# Patient Record
Sex: Female | Born: 1970 | Race: Black or African American | Hispanic: No | Marital: Single | Smoking: Never smoker
Health system: Southern US, Community
[De-identification: ages and names within clinical notes are randomized; demographics above are authoritative.]

## PROBLEM LIST (undated history)

## (undated) DIAGNOSIS — I351 Nonrheumatic aortic (valve) insufficiency: Secondary | ICD-10-CM

## (undated) DIAGNOSIS — I890 Lymphedema, not elsewhere classified: Secondary | ICD-10-CM

## (undated) DIAGNOSIS — F32A Depression, unspecified: Secondary | ICD-10-CM

## (undated) DIAGNOSIS — E785 Hyperlipidemia, unspecified: Secondary | ICD-10-CM

## (undated) DIAGNOSIS — F419 Anxiety disorder, unspecified: Secondary | ICD-10-CM

## (undated) DIAGNOSIS — F329 Major depressive disorder, single episode, unspecified: Secondary | ICD-10-CM

## (undated) DIAGNOSIS — M1990 Unspecified osteoarthritis, unspecified site: Secondary | ICD-10-CM

## (undated) DIAGNOSIS — E119 Type 2 diabetes mellitus without complications: Secondary | ICD-10-CM

## (undated) DIAGNOSIS — D689 Coagulation defect, unspecified: Secondary | ICD-10-CM

## (undated) DIAGNOSIS — G4700 Insomnia, unspecified: Secondary | ICD-10-CM

## (undated) DIAGNOSIS — J309 Allergic rhinitis, unspecified: Secondary | ICD-10-CM

## (undated) DIAGNOSIS — E559 Vitamin D deficiency, unspecified: Secondary | ICD-10-CM

## (undated) DIAGNOSIS — R011 Cardiac murmur, unspecified: Secondary | ICD-10-CM

## (undated) DIAGNOSIS — Z30432 Encounter for removal of intrauterine contraceptive device: Secondary | ICD-10-CM

## (undated) DIAGNOSIS — Z8672 Personal history of thrombophlebitis: Secondary | ICD-10-CM

## (undated) DIAGNOSIS — M62838 Other muscle spasm: Secondary | ICD-10-CM

## (undated) DIAGNOSIS — N943 Premenstrual tension syndrome: Secondary | ICD-10-CM

## (undated) DIAGNOSIS — I10 Essential (primary) hypertension: Secondary | ICD-10-CM

## (undated) HISTORY — DX: Other muscle spasm: M62.838

## (undated) HISTORY — DX: Major depressive disorder, single episode, unspecified: F32.9

## (undated) HISTORY — DX: Hyperlipidemia, unspecified: E78.5

## (undated) HISTORY — DX: Insomnia, unspecified: G47.00

## (undated) HISTORY — DX: Encounter for removal of intrauterine contraceptive device: Z30.432

## (undated) HISTORY — DX: Cardiac murmur, unspecified: R01.1

## (undated) HISTORY — DX: Essential (primary) hypertension: I10

## (undated) HISTORY — DX: Anxiety disorder, unspecified: F41.9

## (undated) HISTORY — DX: Type 2 diabetes mellitus without complications: E11.9

## (undated) HISTORY — DX: Coagulation defect, unspecified: D68.9

## (undated) HISTORY — DX: Personal history of thrombophlebitis: Z86.72

## (undated) HISTORY — DX: Vitamin D deficiency, unspecified: E55.9

## (undated) HISTORY — DX: Depression, unspecified: F32.A

## (undated) HISTORY — DX: Morbid (severe) obesity due to excess calories: E66.01

## (undated) HISTORY — PX: CHOLECYSTECTOMY: SHX55

## (undated) HISTORY — DX: Allergic rhinitis, unspecified: J30.9

## (undated) HISTORY — DX: Premenstrual tension syndrome: N94.3

## (undated) HISTORY — DX: Lymphedema, not elsewhere classified: I89.0

## (undated) HISTORY — DX: Nonrheumatic aortic (valve) insufficiency: I35.1

## (undated) HISTORY — PX: GASTRIC BYPASS: SHX52

## (undated) HISTORY — DX: Unspecified osteoarthritis, unspecified site: M19.90

---

## 2004-01-20 DIAGNOSIS — Z86718 Personal history of other venous thrombosis and embolism: Secondary | ICD-10-CM | POA: Insufficient documentation

## 2004-02-20 ENCOUNTER — Other Ambulatory Visit: Payer: Self-pay

## 2004-07-29 ENCOUNTER — Other Ambulatory Visit: Payer: Self-pay

## 2005-01-26 ENCOUNTER — Ambulatory Visit: Payer: Self-pay

## 2005-09-17 DIAGNOSIS — Z9884 Bariatric surgery status: Secondary | ICD-10-CM | POA: Insufficient documentation

## 2008-02-12 DIAGNOSIS — E668 Other obesity: Secondary | ICD-10-CM | POA: Insufficient documentation

## 2009-05-14 ENCOUNTER — Emergency Department: Payer: Self-pay | Admitting: Emergency Medicine

## 2009-06-02 ENCOUNTER — Ambulatory Visit: Payer: Self-pay | Admitting: Family Medicine

## 2009-12-29 DIAGNOSIS — M722 Plantar fascial fibromatosis: Secondary | ICD-10-CM | POA: Insufficient documentation

## 2010-01-05 ENCOUNTER — Ambulatory Visit: Payer: Self-pay | Admitting: Family Medicine

## 2010-01-05 DIAGNOSIS — I359 Nonrheumatic aortic valve disorder, unspecified: Secondary | ICD-10-CM | POA: Insufficient documentation

## 2011-01-12 ENCOUNTER — Ambulatory Visit: Payer: Self-pay | Admitting: Family Medicine

## 2012-12-11 ENCOUNTER — Ambulatory Visit: Payer: Self-pay | Admitting: Family Medicine

## 2013-02-21 ENCOUNTER — Emergency Department: Payer: Self-pay | Admitting: Emergency Medicine

## 2013-09-21 LAB — HM PAP SMEAR: HM Pap smear: NORMAL

## 2013-11-21 ENCOUNTER — Emergency Department: Payer: Self-pay | Admitting: Emergency Medicine

## 2013-11-21 LAB — RAPID INFLUENZA A&B ANTIGENS

## 2014-08-13 ENCOUNTER — Ambulatory Visit: Payer: Self-pay | Admitting: Family Medicine

## 2014-08-13 LAB — HM MAMMOGRAPHY: HM Mammogram: NORMAL

## 2014-09-18 ENCOUNTER — Encounter: Payer: Self-pay | Admitting: *Deleted

## 2014-09-19 ENCOUNTER — Encounter (INDEPENDENT_AMBULATORY_CARE_PROVIDER_SITE_OTHER): Payer: Self-pay

## 2014-09-19 ENCOUNTER — Ambulatory Visit (INDEPENDENT_AMBULATORY_CARE_PROVIDER_SITE_OTHER): Payer: 59 | Admitting: Cardiovascular Disease

## 2014-09-19 ENCOUNTER — Encounter: Payer: Self-pay | Admitting: Cardiovascular Disease

## 2014-09-19 VITALS — BP 115/83 | HR 66 | Ht 64.0 in | Wt 341.5 lb

## 2014-09-19 DIAGNOSIS — H9191 Unspecified hearing loss, right ear: Secondary | ICD-10-CM

## 2014-09-19 DIAGNOSIS — R7303 Prediabetes: Secondary | ICD-10-CM

## 2014-09-19 DIAGNOSIS — E785 Hyperlipidemia, unspecified: Secondary | ICD-10-CM

## 2014-09-19 DIAGNOSIS — R7309 Other abnormal glucose: Secondary | ICD-10-CM

## 2014-09-19 DIAGNOSIS — R002 Palpitations: Secondary | ICD-10-CM | POA: Insufficient documentation

## 2014-09-19 DIAGNOSIS — I10 Essential (primary) hypertension: Secondary | ICD-10-CM

## 2014-09-19 DIAGNOSIS — Z9884 Bariatric surgery status: Secondary | ICD-10-CM

## 2014-09-19 DIAGNOSIS — H9190 Unspecified hearing loss, unspecified ear: Secondary | ICD-10-CM | POA: Insufficient documentation

## 2014-09-19 NOTE — Assessment & Plan Note (Signed)
Blood pressure well controlled on today's visit.

## 2014-09-19 NOTE — Assessment & Plan Note (Signed)
Tolerating her Lipitor 40 mg daily

## 2014-09-19 NOTE — Assessment & Plan Note (Signed)
She denies having any heart palpitations. She actually has ulceration in the right side of her head. No symptoms for the past week. Etiology unclear. She reports that her blood pressure has been well controlled, she denies any cardiac palpitations or fluttering. No recent upper respiratory infections. Symptoms did seem to resolve with prednisone over the past several days which was prescribed for laryngitis. We have recommended that if she has any cardiac palpitations or pulsations in her head concerning for irregular heart  Rhythm, that she call our office for a Holter monitor. She works for [employer] and the Holter would be done through her work

## 2014-09-19 NOTE — Patient Instructions (Signed)
Your next appointment will be scheduled in our new office located at :  [facility]  [address], [place], [place] [address]  You are doing well. No medication changes were made.  Go to the apps store Search for "pulse meter" Download instant heart rate, cardiograph  Call for worsening head pulsations or heart fluttering, We could order a holter monitor  Please call us if you have new issues that need to be addressed before your next appt.

## 2014-09-19 NOTE — Progress Notes (Signed)
Patient ID: Kara Richmond, female    DOB: Mar 06, 1971, 43 y.o.   MRN: [id number]  HPI Comments: Kara Richmond is a pleasant 42 year old woman with morbid obesity, history of gastric bypass, hypertension, diet-controlled diabetes type 2, history of DVT, mild aortic valve regurgitation by echocardiogram, who presents for new patient evaluation for pulsation in the right side of her head. She is a patient of Dr. Ancil Boozer.  She reports that for 2 weeks she had a notable pulsation in the right side of her head in her ear. Typically this would occur during the daytime, when she was sitting, denying having any symptoms at nighttime or when she is sleeping. Symptoms would only occur in her right ear. In her left ear, she typically had an ear piece as she was listening to books on tape. Symptoms were significant for 2 weeks that have been better in the past week. She does report that she has been on prednisone for several days this week. She denies any upper respiratory infection, no sinus congestion, no nasal discharge, no cough. She does have laryngitis for which she was taking the prednisone. She she developed this after the flu shot.  She reports having chronic sweating, chronic balance issues She is concerned as she is having some hearing loss in the right ear as well. She states that there is a family history of hearing loss He denies any tachycardia or palpitations  Recent lab work shows hemoglobin A1c 5.9, total cholesterol 209, LDL 136, vitamin D 32  EKG today shows normal sinus rhythm with no significant ST or T-wave changes EKG from primary care is essentially normal  Social History   Marital Status: Single                            Occupational History Works at [employer] Top[employer]   Smoking Status: Never Smoker                             Alcohol Use: No             Drug Use: No            Family history; indicated that her mother is alive. She indicated that her father  is alive. She indicated that her sister is alive.  History of hearing loss, no significant history of coronary artery disease       Outpatient Encounter Prescriptions as of 09/19/2014  Medication Sig  . ALPRAZolam (XANAX) 0.5 MG tablet Take 0.5 mg by mouth at bedtime as needed for anxiety.  Marland Kitchen aspirin 81 MG tablet Take 81 mg by mouth daily.  Marland Kitchen atorvastatin (LIPITOR) 40 MG tablet Take 40 mg by mouth daily.  . Bismuth Subsalicylate (PEPTO-BISMOL PO) Take by mouth as needed.  . Calcium-Vitamin D-Vitamin K ([employer] CALCIUM SOFT CHEWS) [phone] MG-UNT-MCG CHEW Chew 2 tablets by mouth 2 (two) times daily.  . Cholecalciferol (VITAMIN D) 2000 UNITS tablet Take 2,000 Units by mouth daily.  . citalopram (CELEXA) 40 MG tablet Take 40 mg by mouth daily.  Marland Kitchen EPINEPHrine (EPIPEN 2-PAK) 0.3 mg/0.3 mL IJ SOAJ injection Inject 0.3 mg into the muscle once.  Marland Kitchen levonorgestrel (MIRENA) 20 MCG/24HR IUD 1 each by Intrauterine route once.  Marland Kitchen lisinopril-hydrochlorothiazide (PRINZIDE,ZESTORETIC) 10-12.5 MG per tablet Take 1 tablet by mouth daily.   Marland Kitchen MAGNESIUM GLUCONATE PO Take 500 mg by mouth 2 (two) times  daily.  . Multiple Vitamin (MULTIVITAMIN) tablet Take 1 tablet by mouth daily.  . predniSONE (DELTASONE) 20 MG tablet Take 20 mg by mouth daily with breakfast.   . temazepam (RESTORIL) 22.5 MG capsule Take 22.5 mg by mouth at bedtime as needed.   . traMADol (ULTRAM) 50 MG tablet Take 50 mg by mouth 2 (two) times daily.   . Zinc Gluconate 100 MG TABS Take by mouth daily.     Review of Systems  Constitutional: Negative.   HENT: Negative.        Pulsation in the right side of her head Hearing loss on the right  Eyes: Negative.   Respiratory: Negative.   Cardiovascular: Negative.   Gastrointestinal: Negative.   Endocrine: Negative.   Musculoskeletal: Negative.   Skin: Negative.   Allergic/Immunologic: Negative.   Neurological: Negative.   Hematological: Negative.   Psychiatric/Behavioral: Negative.    All other systems reviewed and are negative.   BP 115/83  Pulse 66  Ht 5' 4"  (1.626 m)  Wt 341 lb 8 oz (154.903 kg)  BMI 58.59 kg/m2  Physical Exam  Nursing note and vitals reviewed. Constitutional: She is oriented to person, place, and time. She appears well-developed and well-nourished.  Morbidly obese  HENT:  Head: Normocephalic.  Nose: Nose normal.  Mouth/Throat: Oropharynx is clear and moist.  Eyes: Conjunctivae are normal. Pupils are equal, round, and reactive to light.  Neck: Normal range of motion. Neck supple. No JVD present.  Cardiovascular: Normal rate, regular rhythm, S1 normal, S2 normal, normal heart sounds and intact distal pulses.  Exam reveals no gallop and no friction rub.   No murmur heard. Pulmonary/Chest: Effort normal and breath sounds normal. No respiratory distress. She has no wheezes. She has no rales. She exhibits no tenderness.  Abdominal: Soft. Bowel sounds are normal. She exhibits no distension. There is no tenderness.  Musculoskeletal: Normal range of motion. She exhibits no edema and no tenderness.  Lymphadenopathy:    She has no cervical adenopathy.  Neurological: She is alert and oriented to person, place, and time. Coordination normal.  Skin: Skin is warm and dry. No rash noted. No erythema.  Psychiatric: She has a normal mood and affect. Her behavior is normal. Judgment and thought content normal.    Assessment and Plan

## 2014-09-19 NOTE — Assessment & Plan Note (Signed)
We have encouraged continued exercise, careful diet management in an effort to lose weight. 

## 2014-09-19 NOTE — Assessment & Plan Note (Signed)
She is concerned about waxing waning hearing loss on the right. She does report a family history. Suggested she monitor her symptoms and discussed this with Dr. Ancil Boozer. If symptoms get worse, might need a hearing study or referral to ENT

## 2014-12-10 ENCOUNTER — Ambulatory Visit: Payer: Self-pay | Admitting: Family Medicine

## 2015-06-08 ENCOUNTER — Telehealth: Payer: Self-pay | Admitting: Family Medicine

## 2015-06-08 ENCOUNTER — Encounter: Payer: Self-pay | Admitting: Family Medicine

## 2015-06-08 DIAGNOSIS — G4700 Insomnia, unspecified: Secondary | ICD-10-CM | POA: Insufficient documentation

## 2015-06-08 DIAGNOSIS — Z8619 Personal history of other infectious and parasitic diseases: Secondary | ICD-10-CM | POA: Insufficient documentation

## 2015-06-08 DIAGNOSIS — F32A Depression, unspecified: Secondary | ICD-10-CM | POA: Insufficient documentation

## 2015-06-08 DIAGNOSIS — J309 Allergic rhinitis, unspecified: Secondary | ICD-10-CM | POA: Insufficient documentation

## 2015-06-08 DIAGNOSIS — E785 Hyperlipidemia, unspecified: Secondary | ICD-10-CM

## 2015-06-08 DIAGNOSIS — F419 Anxiety disorder, unspecified: Secondary | ICD-10-CM

## 2015-06-08 DIAGNOSIS — N943 Premenstrual tension syndrome: Secondary | ICD-10-CM | POA: Insufficient documentation

## 2015-06-08 DIAGNOSIS — M1990 Unspecified osteoarthritis, unspecified site: Secondary | ICD-10-CM | POA: Insufficient documentation

## 2015-06-08 DIAGNOSIS — E559 Vitamin D deficiency, unspecified: Secondary | ICD-10-CM | POA: Insufficient documentation

## 2015-06-08 DIAGNOSIS — H6990 Unspecified Eustachian tube disorder, unspecified ear: Secondary | ICD-10-CM | POA: Insufficient documentation

## 2015-06-08 DIAGNOSIS — F329 Major depressive disorder, single episode, unspecified: Secondary | ICD-10-CM | POA: Insufficient documentation

## 2015-06-08 DIAGNOSIS — I890 Lymphedema, not elsewhere classified: Secondary | ICD-10-CM | POA: Insufficient documentation

## 2015-06-08 DIAGNOSIS — Z79899 Other long term (current) drug therapy: Secondary | ICD-10-CM

## 2015-06-08 NOTE — Telephone Encounter (Signed)
PT REQUESTING LAB SLIP FOR LABS BEFORE CPE

## 2015-06-08 NOTE — Telephone Encounter (Signed)
Done. Ready for pick up I ordered CBC, comp panel and lipid panel . Ask patient if she would like to be checked for anything else

## 2015-06-09 NOTE — Telephone Encounter (Signed)
ERRENOUS

## 2015-06-10 ENCOUNTER — Ambulatory Visit: Payer: Self-pay | Admitting: Family Medicine

## 2015-06-20 LAB — CBC WITH DIFFERENTIAL/PLATELET
Basophils Absolute: 0 10*3/uL (ref 0.0–0.2)
Basos: 0 %
EOS (ABSOLUTE): 0 10*3/uL (ref 0.0–0.4)
Eos: 1 %
Hematocrit: 36.9 % (ref 34.0–46.6)
Hemoglobin: 11.7 g/dL (ref 11.1–15.9)
Immature Grans (Abs): 0 10*3/uL (ref 0.0–0.1)
Immature Granulocytes: 0 %
Lymphocytes Absolute: 2.1 10*3/uL (ref 0.7–3.1)
Lymphs: 37 %
MCH: 25 pg — ABNORMAL LOW (ref 26.6–33.0)
MCHC: 31.7 g/dL (ref 31.5–35.7)
MCV: 79 fL (ref 79–97)
Monocytes Absolute: 0.5 10*3/uL (ref 0.1–0.9)
Monocytes: 9 %
Neutrophils Absolute: 3.1 10*3/uL (ref 1.4–7.0)
Neutrophils: 53 %
Platelets: 362 10*3/uL (ref 150–379)
RBC: 4.68 x10E6/uL (ref 3.77–5.28)
RDW: 15.1 % (ref 12.3–15.4)
WBC: 5.8 10*3/uL (ref 3.4–10.8)

## 2015-06-20 LAB — LIPID PANEL
Chol/HDL Ratio: 3.6 ratio (ref 0.0–4.4)
Cholesterol, Total: 179 mg/dL (ref 100–199)
HDL: 50 mg/dL
LDL Calculated: 108 mg/dL — ABNORMAL HIGH (ref 0–99)
Triglycerides: 107 mg/dL (ref 0–149)
VLDL Cholesterol Cal: 21 mg/dL (ref 5–40)

## 2015-06-20 LAB — COMPREHENSIVE METABOLIC PANEL
ALT: 9 IU/L (ref 0–32)
AST: 11 IU/L (ref 0–40)
Albumin/Globulin Ratio: 1.3 (ref 1.1–2.5)
Albumin: 3.5 g/dL (ref 3.5–5.5)
Alkaline Phosphatase: 52 IU/L (ref 39–117)
BUN/Creatinine Ratio: 17 (ref 9–23)
BUN: 14 mg/dL (ref 6–24)
Bilirubin Total: <0.2 mg/dL (ref 0.0–1.2)
CO2: 21 mmol/L (ref 18–29)
Calcium: 8.8 mg/dL (ref 8.7–10.2)
Chloride: 103 mmol/L (ref 97–108)
Creatinine, Ser: 0.84 mg/dL (ref 0.57–1.00)
GFR calc Af Amer: 99 mL/min/{1.73_m2} (ref >59)
GFR calc non Af Amer: 86 mL/min/{1.73_m2} (ref >59)
Globulin, Total: 2.7 g/dL (ref 1.5–4.5)
Glucose: 97 mg/dL (ref 65–99)
Potassium: 4.6 mmol/L (ref 3.5–5.2)
Sodium: 142 mmol/L (ref 134–144)
Total Protein: 6.2 g/dL (ref 6.0–8.5)

## 2015-06-24 NOTE — Telephone Encounter (Signed)
Patient notified

## 2015-06-25 ENCOUNTER — Ambulatory Visit: Payer: Self-pay | Admitting: Family Medicine

## 2015-06-30 ENCOUNTER — Ambulatory Visit (INDEPENDENT_AMBULATORY_CARE_PROVIDER_SITE_OTHER): Payer: 59 | Admitting: Family Medicine

## 2015-06-30 ENCOUNTER — Encounter: Payer: Self-pay | Admitting: Family Medicine

## 2015-06-30 VITALS — BP 132/84 | HR 100 | Temp 97.7°F | Resp 16 | Ht 66.0 in | Wt 342.3 lb

## 2015-06-30 DIAGNOSIS — F329 Major depressive disorder, single episode, unspecified: Secondary | ICD-10-CM

## 2015-06-30 DIAGNOSIS — Z9884 Bariatric surgery status: Secondary | ICD-10-CM | POA: Diagnosis not present

## 2015-06-30 DIAGNOSIS — E785 Hyperlipidemia, unspecified: Secondary | ICD-10-CM

## 2015-06-30 DIAGNOSIS — J069 Acute upper respiratory infection, unspecified: Secondary | ICD-10-CM

## 2015-06-30 DIAGNOSIS — I890 Lymphedema, not elsewhere classified: Secondary | ICD-10-CM

## 2015-06-30 DIAGNOSIS — I10 Essential (primary) hypertension: Secondary | ICD-10-CM | POA: Diagnosis not present

## 2015-06-30 DIAGNOSIS — F419 Anxiety disorder, unspecified: Secondary | ICD-10-CM

## 2015-06-30 DIAGNOSIS — F32A Depression, unspecified: Secondary | ICD-10-CM

## 2015-06-30 DIAGNOSIS — F418 Other specified anxiety disorders: Secondary | ICD-10-CM

## 2015-06-30 MED ORDER — CITALOPRAM HYDROBROMIDE 40 MG PO TABS: 40.0000 mg | ORAL_TABLET | Freq: Every day | ORAL | Status: DC

## 2015-06-30 MED ORDER — BENZONATATE 200 MG PO CAPS: 200.0000 mg | ORAL_CAPSULE | Freq: Three times a day (TID) | ORAL | Status: DC | PRN

## 2015-06-30 MED ORDER — ALPRAZOLAM 0.5 MG PO TABS: 0.5000 mg | ORAL_TABLET | Freq: Every evening | ORAL | Status: DC | PRN

## 2015-06-30 MED ORDER — LISINOPRIL-HYDROCHLOROTHIAZIDE 10-12.5 MG PO TABS: 1.0000 | ORAL_TABLET | Freq: Every day | ORAL | Status: DC

## 2015-06-30 NOTE — Addendum Note (Signed)
Addended by: Steele Sizer F on: 06/30/2015 04:06 PM   Modules accepted: Orders

## 2015-06-30 NOTE — Progress Notes (Signed)
Name: Kara Richmond   MRN: [id number]    DOB: [birth year]   Date:06/30/2015       Progress Note  Subjective  Chief Complaint  Chief Complaint  Patient presents with  . Hypertension    edema in bilateral legs  . Hyperlipidemia  . Insomnia  . Anxiety  . Depression    work is stressing her out  . URI    onset 1 day worsening stuffy head, dry cough, sinus pressure, bilateral ear pain    HPI  HTN: taking medication as prescribed, denies side effects of medication, and bp has been at goal. No chest pain or palpitation.   Hyperlipidemia: she has been taking Atorvastatin, labs reviewed with patient  Insomnia: she has been sleeping well , and is only taking Temazepam seldom now.    Depression/Anxiety: doing well on Citalopram, no crying spells, no anhedonia, has pleasure in doing things she likes, in remission but does not want to stop medications  URI: she states that over the past day she has noticed rhinorrhea, post-nasal drainage, nasal congestion, and facial pressure, dry cough, but no fever.   Patient Active Problem List   Diagnosis Date Noted  . Allergic rhinitis 06/08/2015  . Chronic osteoarthritis 06/08/2015  . Controlled insomnia 06/08/2015  . Anxiety and depression 06/08/2015  . History of diabetes mellitus, type II 06/08/2015  . Dyslipidemia 06/08/2015  . Eustachian tube disorder 06/08/2015  . History of HPV infection 06/08/2015  . Lymphedema 06/08/2015  . Premenstrual tension syndrome 06/08/2015  . Vitamin D deficiency 06/08/2015  . Essential hypertension 09/19/2014  . Aortic valve disorder 01/05/2010  . 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 Extreme obesity 02/12/2008  . History of gastric bypass 09/17/2005  . H/O deep venous thrombosis 01/20/2004    Past Surgical History  Procedure Laterality Date  . Cholecystectomy    . Gastric bypass      Family History  Problem Relation Age of Onset  . Hypertension Mother   . Diabetes Mother   . CVA Mother    . Hypertension Father   . Hypertension Sister   . Diabetes Sister     History   Social History  . Marital Status: Single    Spouse Name: N/A  . Number of Children: N/A  . Years of Education: N/A   Occupational History  . Not on file.   Social History Main Topics  . Smoking status: Never Smoker   . Smokeless tobacco: Not on file  . Alcohol Use: No  . Drug Use: No  . Sexual Activity: Not Currently   Other Topics Concern  . Not on file   Social History Narrative     Current outpatient prescriptions:  .  ALPRAZolam (XANAX) 0.5 MG tablet, Take 1 tablet (0.5 mg total) by mouth at bedtime as needed for anxiety., Disp: 30 tablet, Rfl: 0 .  aspirin 81 MG tablet, Take 81 mg by mouth daily., Disp: , Rfl:  .  atorvastatin (LIPITOR) 40 MG tablet, Take 40 mg by mouth daily., Disp: , Rfl:  .  Bismuth Subsalicylate (PEPTO-BISMOL PO), Take by mouth as needed., Disp: , Rfl:  .  Calcium-Vitamin D-Vitamin K ([employer] CALCIUM SOFT CHEWS) [phone] MG-UNT-MCG CHEW, Chew 2 tablets by mouth 2 (two) times daily., Disp: , Rfl:  .  Cholecalciferol (VITAMIN D) 2000 UNITS tablet, Take 2,000 Units by mouth daily., Disp: , Rfl:  .  citalopram (CELEXA) 40 MG tablet, Take 1 tablet (40 mg total) by mouth daily., Disp: 90 tablet,  Rfl: 1 .  EPINEPHrine (EPIPEN 2-PAK) 0.3 mg/0.3 mL IJ SOAJ injection, Inject 0.3 mg into the muscle once., Disp: , Rfl:  .  levonorgestrel (MIRENA) 20 MCG/24HR IUD, 1 each by Intrauterine route once., Disp: , Rfl:  .  lisinopril-hydrochlorothiazide (PRINZIDE,ZESTORETIC) 10-12.5 MG per tablet, Take 1 tablet by mouth daily., Disp: 90 tablet, Rfl: 1 .  MAGNESIUM GLUCONATE PO, Take 500 mg by mouth 2 (two) times daily., Disp: , Rfl:  .  Multiple Vitamin (MULTIVITAMIN) tablet, Take 1 tablet by mouth daily., Disp: , Rfl:  .  temazepam (RESTORIL) 22.5 MG capsule, Take 22.5 mg by mouth at bedtime as needed. , Disp: , Rfl:  .  Zinc Gluconate 100 MG TABS, Take by mouth daily., Disp: , Rfl:  .   benzonatate (TESSALON) 200 MG capsule, Take 1 capsule (200 mg total) by mouth 3 (three) times daily as needed for cough., Disp: 40 capsule, Rfl: 0  Allergies  Allergen Reactions  . Ace Inhibitors Cough     ROS  Constitutional: Negative for fever but has  weight change.  Respiratory: Positive for cough and shortness of breath.   Cardiovascular: Negative for chest pain or palpitations.  Gastrointestinal: Negative for abdominal pain, no bowel changes.  Musculoskeletal: Negative for gait problem or joint swelling.  Skin: Negative for rash.  Neurological: Negative for dizziness or headache.  No other specific complaints in a complete review of systems (except as listed in HPI above).   Objective  Filed Vitals:   06/30/15 1519  BP: 132/84  Pulse: 100  Temp: 97.7 F (36.5 C)  TempSrc: Oral  Resp: 16  Height: 5' 6"  (1.676 m)  Weight: 342 lb 4.8 oz (155.266 kg)  SpO2: 96%    Body mass index is 55.27 kg/(m^2).  Physical Exam  Constitutional: Patient appears well-developed and well-nourished. Obese No distress.  HEENT: head atraumatic, normocephalic, pupils equal and reactive to light, ears normal TM, normal ear canal ,neck supple, throat , tonsils 3 plus bilaterally no erythema. Mild tenderness during percussion of facial sinus Cardiovascular: Normal rate, regular rhythm and normal heart sounds.  Heart  murmur heard 2/6 SE best heard on left 2nd intercostal space. Non-pitting  BLE edema. Pulmonary/Chest: Effort normal and breath sounds normal. No respiratory distress. Abdominal: Soft.  There is no tenderness. Psychiatric: Patient has a normal mood and affect. behavior is normal. Judgment and thought content normal.  Recent Results (from the past 2160 hour(s))  Lipid Profile     Status: Abnormal   Collection Time: 06/19/15  8:22 AM  Result Value Ref Range   Cholesterol, Total 179 100 - 199 mg/dL   Triglycerides 107 0 - 149 mg/dL   HDL 50 >39 mg/dL    Comment: According to  ATP-III Guidelines, HDL-C >59 mg/dL is considered a negative risk factor for CHD.    VLDL Cholesterol Cal 21 5 - 40 mg/dL   LDL Calculated 108 (H) 0 - 99 mg/dL   Chol/HDL Ratio 3.6 0.0 - 4.4 ratio units    Comment:                                   T. Chol/HDL Ratio                                             Men  Women                               1/2 Avg.Risk  3.4    3.3                                   Avg.Risk  5.0    4.4                                2X Avg.Risk  9.6    7.1                                3X Avg.Risk 23.4   11.0   Comprehensive Metabolic Panel (CMET)     Status: None   Collection Time: 06/19/15  8:22 AM  Result Value Ref Range   Glucose 97 65 - 99 mg/dL   BUN 14 6 - 24 mg/dL   Creatinine, Ser 0.84 0.57 - 1.00 mg/dL   GFR calc non Af Amer 86 >59 mL/min/1.73   GFR calc Af Amer 99 >59 mL/min/1.73   BUN/Creatinine Ratio 17 9 - 23   Sodium 142 134 - 144 mmol/L   Potassium 4.6 3.5 - 5.2 mmol/L   Chloride 103 97 - 108 mmol/L   CO2 21 18 - 29 mmol/L   Calcium 8.8 8.7 - 10.2 mg/dL   Total Protein 6.2 6.0 - 8.5 g/dL   Albumin 3.5 3.5 - 5.5 g/dL   Globulin, Total 2.7 1.5 - 4.5 g/dL   Albumin/Globulin Ratio 1.3 1.1 - 2.5   Bilirubin Total <0.2 0.0 - 1.2 mg/dL   Alkaline Phosphatase 52 39 - 117 IU/L   AST 11 0 - 40 IU/L   ALT 9 0 - 32 IU/L  CBC with Differential     Status: Abnormal   Collection Time: 06/19/15  8:22 AM  Result Value Ref Range   WBC 5.8 3.4 - 10.8 x10E3/uL   RBC 4.68 3.77 - 5.28 x10E6/uL   Hemoglobin 11.7 11.1 - 15.9 g/dL   Hematocrit 36.9 34.0 - 46.6 %   MCV 79 79 - 97 fL   MCH 25.0 (L) 26.6 - 33.0 pg   MCHC 31.7 31.5 - 35.7 g/dL   RDW 15.1 12.3 - 15.4 %   Platelets 362 150 - 379 x10E3/uL   Neutrophils 53 %   Lymphs 37 %   Monocytes 9 %   Eos 1 %   Basos 0 %   Neutrophils Absolute 3.1 1.4 - 7.0 x10E3/uL   Lymphocytes Absolute 2.1 0.7 - 3.1 x10E3/uL   Monocytes Absolute 0.5 0.1 - 0.9 x10E3/uL   EOS (ABSOLUTE) 0.0 0.0 - 0.4 x10E3/uL    Basophils Absolute 0.0 0.0 - 0.2 x10E3/uL   Immature Granulocytes 0 %   Immature Grans (Abs) 0.0 0.0 - 0.1 x10E3/uL     PHQ2/9: Depression screen PHQ 2/9 06/30/2015  Decreased Interest 0  Down, Depressed, Hopeless 0  PHQ - 2 Score 0     Fall Risk: Fall Risk  06/30/2015  Falls in the past year? Yes  Number falls in past yr: 1  Injury with Fall? No     Assessment & Plan  1. Dyslipidemia Continue Atorvastatin  2. Essential hypertension At goal, continue medication  -  lisinopril-hydrochlorothiazide (PRINZIDE,ZESTORETIC) 10-12.5 MG per tablet; Take 1 tablet by mouth daily.  Dispense: 90 tablet; Refill: 1  3. History of gastric bypass We will check other labs in 6 months  4. Morbid obesity Discussed diet and exercise  5. Lymphedema Elevate lower extremity, discussed PT, but patient states it does not bother her that much  6. Anxiety and depression In remission , but does not want to stop medication - citalopram (CELEXA) 40 MG tablet; Take 1 tablet (40 mg total) by mouth daily.  Dispense: 90 tablet; Refill: 1 - ALPRAZolam (XANAX) 0.5 MG tablet; Take 1 tablet (0.5 mg total) by mouth at bedtime as needed for anxiety.  Dispense: 30 tablet; Refill: 0  7. Upper respiratory infection Advised fluids, rest, cough suppressant, nasal saline  - benzonatate (TESSALON) 200 MG capsule; Take 1 capsule (200 mg total) by mouth 3 (three) times daily as needed for cough.  Dispense: 40 capsule; Refill: 0

## 2015-08-12 ENCOUNTER — Ambulatory Visit: Payer: Self-pay | Admitting: Family Medicine

## 2015-08-27 ENCOUNTER — Ambulatory Visit: Payer: 59 | Admitting: Family Medicine

## 2015-09-02 ENCOUNTER — Ambulatory Visit (INDEPENDENT_AMBULATORY_CARE_PROVIDER_SITE_OTHER): Payer: 59 | Admitting: Family Medicine

## 2015-09-02 ENCOUNTER — Encounter: Payer: Self-pay | Admitting: Family Medicine

## 2015-09-02 VITALS — BP 114/62 | HR 111 | Temp 97.7°F | Resp 18 | Ht 66.0 in | Wt 347.7 lb

## 2015-09-02 DIAGNOSIS — Z23 Encounter for immunization: Secondary | ICD-10-CM | POA: Diagnosis not present

## 2015-09-02 DIAGNOSIS — I071 Rheumatic tricuspid insufficiency: Secondary | ICD-10-CM | POA: Diagnosis not present

## 2015-09-02 DIAGNOSIS — E119 Type 2 diabetes mellitus without complications: Secondary | ICD-10-CM | POA: Diagnosis not present

## 2015-09-02 NOTE — Progress Notes (Signed)
Name: Kara Richmond   MRN: [id number]    DOB: 1971/09/14   Date:09/02/2015       Progress Note  Subjective  Chief Complaint  Chief Complaint  Patient presents with  . Follow-up    paperwork for [facility]    HPI  DM II diet controlled: she was diagnosed years ago, had hgbA1C done at screening at work recently and will send me results. She denies polyphagia, polyuria or polydipsia. She is takes baby aspirin daily . Also on ace inhibitor.  Discussed importance of yearly eye exam.   Obesity/Extremity: s/p gastric bypass in 2006, she continues to have weight gain, she would like to have forms for work to be filled out as a reason she has not lost 10 % of her weight since last year.  However currently she has not been exercising, she stopped going to the bariatric center and states her diet is already very lean. Drinks water, eats baked food mostly prepared at home.    Patient Active Problem List   Diagnosis Date Noted  . Diet-controlled type 2 diabetes mellitus ([place]) 09/02/2015  . Allergic rhinitis 06/08/2015  . Chronic osteoarthritis 06/08/2015  . Controlled insomnia 06/08/2015  . Anxiety and depression 06/08/2015  . Dyslipidemia 06/08/2015  . Eustachian tube disorder 06/08/2015  . History of HPV infection 06/08/2015  . Lymphedema 06/08/2015  . Premenstrual tension syndrome 06/08/2015  . Vitamin D deficiency 06/08/2015  . Essential hypertension 09/19/2014  . Aortic valve disorder 01/05/2010  . 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 Extreme obesity ([place]) 02/12/2008  . History of gastric bypass 09/17/2005  . H/O deep venous thrombosis 01/20/2004    Past Surgical History  Procedure Laterality Date  . Cholecystectomy    . Gastric bypass      Family History  Problem Relation Age of Onset  . Hypertension Mother   . Diabetes Mother   . CVA Mother   . Hypertension Father   . Hypertension Sister   . Diabetes Sister     Social History   Social History  . Marital  Status: Single    Spouse Name: N/A  . Number of Children: N/A  . Years of Education: N/A   Occupational History  . Not on file.   Social History Main Topics  . Smoking status: Never Smoker   . Smokeless tobacco: Never Used  . Alcohol Use: No  . Drug Use: No  . Sexual Activity: Not Currently   Other Topics Concern  . Not on file   Social History Narrative     Current outpatient prescriptions:  .  ALPRAZolam (XANAX) 0.5 MG tablet, Take 1 tablet (0.5 mg total) by mouth at bedtime as needed for anxiety., Disp: 30 tablet, Rfl: 0 .  aspirin 81 MG tablet, Take 81 mg by mouth daily., Disp: , Rfl:  .  atorvastatin (LIPITOR) 40 MG tablet, Take 40 mg by mouth daily., Disp: , Rfl:  .  Bismuth Subsalicylate (PEPTO-BISMOL PO), Take by mouth as needed., Disp: , Rfl:  .  Calcium-Vitamin D-Vitamin K ([employer] CALCIUM SOFT CHEWS) [phone] MG-UNT-MCG CHEW, Chew 2 tablets by mouth 2 (two) times daily., Disp: , Rfl:  .  Cholecalciferol (VITAMIN D) 2000 UNITS tablet, Take 2,000 Units by mouth daily., Disp: , Rfl:  .  citalopram (CELEXA) 40 MG tablet, Take 1 tablet (40 mg total) by mouth daily., Disp: 90 tablet, Rfl: 1 .  EPINEPHrine (EPIPEN 2-PAK) 0.3 mg/0.3 mL IJ SOAJ injection, Inject 0.3 mg into the muscle once., Disp: ,  Rfl:  .  levonorgestrel (MIRENA) 20 MCG/24HR IUD, 1 each by Intrauterine route once., Disp: , Rfl:  .  lisinopril-hydrochlorothiazide (PRINZIDE,ZESTORETIC) 10-12.5 MG per tablet, Take 1 tablet by mouth daily., Disp: 90 tablet, Rfl: 1 .  MAGNESIUM GLUCONATE PO, Take 500 mg by mouth 2 (two) times daily., Disp: , Rfl:  .  Multiple Vitamin (MULTIVITAMIN) tablet, Take 1 tablet by mouth daily., Disp: , Rfl:  .  Zinc Gluconate 100 MG TABS, Take by mouth daily., Disp: , Rfl:   Allergies  Allergen Reactions  . Ace Inhibitors Cough     ROS  Constitutional: Negative for fever, positive for  weight change.  Respiratory: Negative for cough and shortness of breath.   Cardiovascular:  Negative for chest pain or palpitations.  Gastrointestinal: Negative for abdominal pain, no bowel changes.  Musculoskeletal: Positive  for gait problem - secondary to left knee pain , but no  joint swelling.  Skin: Negative for rash.  Neurological: Negative for dizziness or headache.  No other specific complaints in a complete review of systems (except as listed in HPI above).   Objective  Filed Vitals:   09/02/15 0927  BP: 114/62  Pulse: 111  Temp: 97.7 F (36.5 C)  TempSrc: Oral  Resp: 18  Height: 5' 6"  (1.676 m)  Weight: 347 lb 11.2 oz (157.716 kg)  SpO2: 96%    Body mass index is 56.15 kg/(m^2).  Physical Exam  Constitutional: Patient appears well-developed and well-nourished. Obese  No distress.  HEENT: head atraumatic, normocephalic, pupils equal and reactive to light, neck supple, throat within normal limits Cardiovascular: Normal rate, regular rhythm she has a 2/6 diastolic murmur. No BLE edema. Pulmonary/Chest: Effort normal and breath sounds normal. No respiratory distress. Abdominal: Soft.  There is no tenderness. Psychiatric: Patient has a normal mood and affect. behavior is normal. Judgment and thought content normal. Muscular Skeletal: still has right knee pain , during palpation of anterior knee , no effusion or redness. Advised her to see Ortho but she wants to hold off for now  Recent Results (from the past 2160 hour(s))  Lipid Profile     Status: Abnormal   Collection Time: 06/19/15  8:22 AM  Result Value Ref Range   Cholesterol, Total 179 100 - 199 mg/dL   Triglycerides 107 0 - 149 mg/dL   HDL 50 >39 mg/dL    Comment: According to ATP-III Guidelines, HDL-C >59 mg/dL is considered a negative risk factor for CHD.    VLDL Cholesterol Cal 21 5 - 40 mg/dL   LDL Calculated 108 (H) 0 - 99 mg/dL   Chol/HDL Ratio 3.6 0.0 - 4.4 ratio units    Comment:                                   T. Chol/HDL Ratio                                             Men  Women                                1/2 Avg.Risk  3.4    3.3  Avg.Risk  5.0    4.4                                2X Avg.Risk  9.6    7.1                                3X Avg.Risk 23.4   11.0   Comprehensive Metabolic Panel (CMET)     Status: None   Collection Time: 06/19/15  8:22 AM  Result Value Ref Range   Glucose 97 65 - 99 mg/dL   BUN 14 6 - 24 mg/dL   Creatinine, Ser 0.84 0.57 - 1.00 mg/dL   GFR calc non Af Amer 86 >59 mL/min/1.73   GFR calc Af Amer 99 >59 mL/min/1.73   BUN/Creatinine Ratio 17 9 - 23   Sodium 142 134 - 144 mmol/L   Potassium 4.6 3.5 - 5.2 mmol/L   Chloride 103 97 - 108 mmol/L   CO2 21 18 - 29 mmol/L   Calcium 8.8 8.7 - 10.2 mg/dL   Total Protein 6.2 6.0 - 8.5 g/dL   Albumin 3.5 3.5 - 5.5 g/dL   Globulin, Total 2.7 1.5 - 4.5 g/dL   Albumin/Globulin Ratio 1.3 1.1 - 2.5   Bilirubin Total <0.2 0.0 - 1.2 mg/dL   Alkaline Phosphatase 52 39 - 117 IU/L   AST 11 0 - 40 IU/L   ALT 9 0 - 32 IU/L  CBC with Differential     Status: Abnormal   Collection Time: 06/19/15  8:22 AM  Result Value Ref Range   WBC 5.8 3.4 - 10.8 x10E3/uL   RBC 4.68 3.77 - 5.28 x10E6/uL   Hemoglobin 11.7 11.1 - 15.9 g/dL   Hematocrit 36.9 34.0 - 46.6 %   MCV 79 79 - 97 fL   MCH 25.0 (L) 26.6 - 33.0 pg   MCHC 31.7 31.5 - 35.7 g/dL   RDW 15.1 12.3 - 15.4 %   Platelets 362 150 - 379 x10E3/uL   Neutrophils 53 %   Lymphs 37 %   Monocytes 9 %   Eos 1 %   Basos 0 %   Neutrophils Absolute 3.1 1.4 - 7.0 x10E3/uL   Lymphocytes Absolute 2.1 0.7 - 3.1 x10E3/uL   Monocytes Absolute 0.5 0.1 - 0.9 x10E3/uL   EOS (ABSOLUTE) 0.0 0.0 - 0.4 x10E3/uL   Basophils Absolute 0.0 0.0 - 0.2 x10E3/uL   Immature Granulocytes 0 %   Immature Grans (Abs) 0.0 0.0 - 0.1 x10E3/uL    Diabetic Foot Exam:  Diabetic Foot Exam - Simple   Simple Foot Form  Visual Inspection  No deformities, no ulcerations, no other skin breakdown bilaterally:  Yes  Sensation Testing  Intact to touch and  monofilament testing bilaterally:  Yes  Pulse Check  Posterior Tibialis and Dorsalis pulse intact bilaterally:  Yes  Comments     PHQ2/9: Depression screen [facility] 2/9 09/02/2015 06/30/2015  Decreased Interest 0 0  Down, Depressed, Hopeless 0 0  PHQ - 2 Score 0 0    Fall Risk: Fall Risk  09/02/2015 06/30/2015  Falls in the past year? Yes Yes  Number falls in past yr: 1 1  Injury with Fall? No No    Functional Status Survey: Is the patient deaf or have difficulty hearing?: No Does the patient have difficulty seeing, even when wearing glasses/contacts?: No Does the  patient have difficulty concentrating, remembering, or making decisions?: No Does the patient have difficulty walking or climbing stairs?: No Does the patient have difficulty dressing or bathing?: No Does the patient have difficulty doing errands alone such as visiting a doctor's office or shopping?: No   Assessment & Plan  1. Diet-controlled type 2 diabetes mellitus ([place])  On diet only, she will fax me results of her hgbA1C , advised yearly eye exam  2. Needs flu shot  - Flu Vaccine QUAD 36+ mos PF IM (Fluarix & Fluzone Quad PF)  3. Morbid obesity due to excess calories [facility])  She will go back to the bariatric clinic tomorrow, also discussed water exercises since she has left knee pain, she will continue her diet, but will use My Fitness Pal to log calories . I will fill out form for [facility] if Bariatric center sends me a note with their plan for her weight loss.    4. Need for vaccination for Strep pneumoniae with PCV 7  - Pneumococcal polysaccharide vaccine 23-valent greater than or equal to 2yo subcutaneous/IM

## 2015-10-23 ENCOUNTER — Ambulatory Visit: Payer: Self-pay | Admitting: Family Medicine

## 2015-10-30 ENCOUNTER — Ambulatory Visit: Payer: 59 | Admitting: Family Medicine

## 2015-12-16 ENCOUNTER — Ambulatory Visit (INDEPENDENT_AMBULATORY_CARE_PROVIDER_SITE_OTHER): Payer: 59 | Admitting: Family Medicine

## 2015-12-16 ENCOUNTER — Encounter: Payer: Self-pay | Admitting: Family Medicine

## 2015-12-16 VITALS — BP 118/78 | HR 111 | Temp 98.4°F | Resp 16 | Ht 64.0 in | Wt 344.0 lb

## 2015-12-16 DIAGNOSIS — Z111 Encounter for screening for respiratory tuberculosis: Secondary | ICD-10-CM | POA: Diagnosis not present

## 2015-12-16 DIAGNOSIS — R197 Diarrhea, unspecified: Secondary | ICD-10-CM

## 2015-12-16 DIAGNOSIS — R112 Nausea with vomiting, unspecified: Secondary | ICD-10-CM | POA: Diagnosis not present

## 2015-12-16 DIAGNOSIS — A084 Viral intestinal infection, unspecified: Secondary | ICD-10-CM

## 2015-12-16 MED ORDER — ONDANSETRON 8 MG PO TBDP: 8.0000 mg | ORAL_TABLET | Freq: Three times a day (TID) | ORAL | Status: DC | PRN

## 2015-12-16 NOTE — Progress Notes (Signed)
Name: Kara Richmond   MRN: [id number]    DOB: [birth year]   Date:12/16/2015       Progress Note  Subjective  Chief Complaint  Chief Complaint  Patient presents with  . GI Problem    onset 3 days nausea, vomiting and dirrhea    HPI  Kara Richmond is a 46 year old female with chronic medical conditions including HTN, Anxiety with Depression, HLD, DM II, Obesity here today with acute concerns of GI upset. Onset 3 days ago. Symptoms consist of nausea, vomiting, diarrhea. Occasional abdominal cramping pain generalized but not the prominent symptom. Positive sick contact her daughter started with diarrhea one day before her. Diagnosed with viral gastroenterology at ER then pediatrics office this morning. Miliyah has been keeping well hydrated and tried imodium with minimal relief of frequent bowel movements. No blood with BMs.   Also needs TB testing for work.   Past Medical History  Diagnosis Date  . Anxiety and depression   . Hypertension   . Chronic osteoarthritis   . Diabetes mellitus without complication ([place])   . Controlled insomnia   . Dyslipidemia   . Lymphedema of leg   . Muscle spasm   . Vitamin D deficiency   . PMT (premenstrual tension)   . AR (allergic rhinitis)   . Hx of deep vein thrombophlebitis of lower extremity   . Morbid obesity ([place])   . Mild aortic regurgitation   . Heart murmur   . Hyperlipidemia   . Clotting disorder ([place])     x2 lung; x1 left leg    Patient Active Problem List   Diagnosis Date Noted  . Diet-controlled type 2 diabetes mellitus ([place]) 09/02/2015  . Mild tricuspid regurgitation 09/02/2015  . Allergic rhinitis 06/08/2015  . Chronic osteoarthritis 06/08/2015  . Controlled insomnia 06/08/2015  . Anxiety and depression 06/08/2015  . Dyslipidemia 06/08/2015  . Eustachian tube disorder 06/08/2015  . History of HPV infection 06/08/2015  . Lymphedema 06/08/2015  . Premenstrual tension syndrome 06/08/2015  . Vitamin D deficiency 06/08/2015   . Essential hypertension 09/19/2014  . Aortic valve disorder 01/05/2010  . 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 Extreme obesity ([place]) 02/12/2008  . History of gastric bypass 09/17/2005  . H/O deep venous thrombosis 01/20/2004    Social History  Substance Use Topics  . Smoking status: Never Smoker   . Smokeless tobacco: Never Used  . Alcohol Use: No     Current outpatient prescriptions:  .  ALPRAZolam (XANAX) 0.5 MG tablet, Take 1 tablet (0.5 mg total) by mouth at bedtime as needed for anxiety., Disp: 30 tablet, Rfl: 0 .  aspirin 81 MG tablet, Take 81 mg by mouth daily., Disp: , Rfl:  .  atorvastatin (LIPITOR) 40 MG tablet, Take 40 mg by mouth daily., Disp: , Rfl:  .  Bismuth Subsalicylate (PEPTO-BISMOL PO), Take by mouth as needed., Disp: , Rfl:  .  Calcium-Vitamin D-Vitamin K ([employer] CALCIUM SOFT CHEWS) [phone] MG-UNT-MCG CHEW, Chew 2 tablets by mouth 2 (two) times daily., Disp: , Rfl:  .  Cholecalciferol (VITAMIN D) 2000 UNITS tablet, Take 2,000 Units by mouth daily., Disp: , Rfl:  .  citalopram (CELEXA) 40 MG tablet, Take 1 tablet (40 mg total) by mouth daily., Disp: 90 tablet, Rfl: 1 .  EPINEPHrine (EPIPEN 2-PAK) 0.3 mg/0.3 mL IJ SOAJ injection, Inject 0.3 mg into the muscle once., Disp: , Rfl:  .  levonorgestrel (MIRENA) 20 MCG/24HR IUD, 1 each by Intrauterine route once., Disp: , Rfl:  .  lisinopril-hydrochlorothiazide (PRINZIDE,ZESTORETIC) 10-12.5 MG per tablet, Take 1 tablet by mouth daily., Disp: 90 tablet, Rfl: 1 .  MAGNESIUM GLUCONATE PO, Take 500 mg by mouth 2 (two) times daily., Disp: , Rfl:  .  Multiple Vitamin (MULTIVITAMIN) tablet, Take 1 tablet by mouth daily., Disp: , Rfl:  .  Zinc Gluconate 100 MG TABS, Take by mouth daily., Disp: , Rfl:   Past Surgical History  Procedure Laterality Date  . Cholecystectomy    . Gastric bypass      Family History  Problem Relation Age of Onset  . Hypertension Mother   . Diabetes Mother   . CVA Mother   .  Hypertension Father   . Hypertension Sister   . Diabetes Sister     Allergies  Allergen Reactions  . Ace Inhibitors Cough     Review of Systems  CONSTITUTIONAL: No significant weight changes, fever, chills, weakness or fatigue.  HEENT:  - Eyes: No visual changes.  - Ears: No auditory changes. No pain.  - Nose: No sneezing, congestion, runny nose. - Throat: No sore throat. No changes in swallowing. SKIN: No rash or itching.  CARDIOVASCULAR: No chest pain, chest pressure or chest discomfort. No palpitations or edema.  RESPIRATORY: No shortness of breath, cough or sputum.  GASTROINTESTINAL: Yes anorexia, nausea, vomiting. Yes  bowel habits. Yes intermittent abdominal pain. GENITOURINARY: No dysuria. No frequency. No discharge. NEUROLOGICAL: No headache, dizziness, syncope, paralysis, ataxia, numbness or tingling in the extremities. No memory changes. No change in bowel or bladder control.  MUSCULOSKELETAL: No joint pain. No muscle pain. HEMATOLOGIC: No anemia, bleeding or bruising.  LYMPHATICS: No enlarged lymph nodes.  PSYCHIATRIC: No change in mood. No change in sleep pattern.  ENDOCRINOLOGIC: No reports of sweating, cold or heat intolerance. No polyuria or polydipsia.     Objective  BP 118/78 mmHg  Pulse 111  Temp(Src) 98.4 F (36.9 C) (Oral)  Resp 16  Ht 5' 4"  (1.626 m)  Wt 344 lb (156.037 kg)  BMI 59.02 kg/m2  SpO2 94%  LMP 11/24/2015 Body mass index is 59.02 kg/(m^2).  Physical Exam  Constitutional: Patient is morbidly obese and well-nourished. In no distress.  Oral mucosa moist with no lesions or tonsil erythema/hypertrophy.  Cardiovascular: Normal rate, regular rhythm and normal heart sounds.  No murmur heard.  Pulmonary/Chest: Effort normal and breath sounds normal. No respiratory distress. Abdomen: Difficult exam due to body habitus but normal bowel sounds, no guarding or rebound, non tender.  Musculoskeletal: Normal range of motion bilateral UE and LE, no  joint effusions. Peripheral vascular: Bilateral LE stable chronic non pitting lymphedema Left worse than right. Neurological: CN II-XII grossly intact with no focal deficits. Alert and oriented to person, place, and time. Coordination, balance, strength, speech and gait are normal.  Skin: Skin is warm and dry. No rash noted. No erythema.  Psychiatric: Patient has a stable mood and affect. Behavior is normal in office today. Judgment and thought content normal in office today.  Assessment & Plan  1. Nausea vomiting and diarrhea Advised patient that supportive therapy with modified diet and good hydration is the best action at this point. Avoid anti-diarrheals. Will get general blood work and CMP. If symptoms persist beyond this week recommended stool studies.  - ondansetron (ZOFRAN-ODT) 8 MG disintegrating tablet; Take 1 tablet (8 mg total) by mouth every 8 (eight) hours as needed for nausea or vomiting.  Dispense: 30 tablet; Refill: 1 - CBC with Differential/Platelet - Comprehensive metabolic panel  2. Viral gastroenteritis  -  CBC with Differential/Platelet - Comprehensive metabolic panel  3. Screening for tuberculosis Patient requested this.  - Quantiferon tb gold assay

## 2015-12-16 NOTE — Patient Instructions (Signed)
1) Keep hydrated with water and other flavored liquids 2) Consider picking up a Pro-Biotic OTC  Viral Gastroenteritis Viral gastroenteritis is also known as stomach flu. This condition affects the stomach and intestinal tract. It can cause sudden diarrhea and vomiting. The illness typically lasts 3 to 8 days. Most people develop an immune response that eventually gets rid of the virus. While this natural response develops, the virus can make you quite ill. CAUSES  Many different viruses can cause gastroenteritis, such as rotavirus or noroviruses. You can catch one of these viruses by consuming contaminated food or water. You may also catch a virus by sharing utensils or other personal items with an infected person or by touching a contaminated surface. SYMPTOMS  The most common symptoms are diarrhea and vomiting. These problems can cause a severe loss of body fluids (dehydration) and a body salt (electrolyte) imbalance. Other symptoms may include:  Fever.  Headache.  Fatigue.  Abdominal pain. DIAGNOSIS  Your caregiver can usually diagnose viral gastroenteritis based on your symptoms and a physical exam. A stool sample may also be taken to test for the presence of viruses or other infections. TREATMENT  This illness typically goes away on its own. Treatments are aimed at rehydration. The most serious cases of viral gastroenteritis involve vomiting so severely that you are not able to keep fluids down. In these cases, fluids must be given through an intravenous line (IV). HOME CARE INSTRUCTIONS   Drink enough fluids to keep your urine clear or pale yellow. Drink small amounts of fluids frequently and increase the amounts as tolerated.  Ask your caregiver for specific rehydration instructions.  Avoid:  Foods high in sugar.  Alcohol.  Carbonated drinks.  Tobacco.  Juice.  Caffeine drinks.  Extremely hot or cold fluids.  Fatty, greasy foods.  Too much intake of anything at one  time.  Dairy products until 24 to 48 hours after diarrhea stops.  You may consume probiotics. Probiotics are active cultures of beneficial bacteria. They may lessen the amount and number of diarrheal stools in adults. Probiotics can be found in yogurt with active cultures and in supplements.  Wash your hands well to avoid spreading the virus.  Only take over-the-counter or prescription medicines for pain, discomfort, or fever as directed by your caregiver. Do not give aspirin to children. Antidiarrheal medicines are not recommended.  Ask your caregiver if you should continue to take your regular prescribed and over-the-counter medicines.  Keep all follow-up appointments as directed by your caregiver. SEEK IMMEDIATE MEDICAL CARE IF:   You are unable to keep fluids down.  You do not urinate at least once every 6 to 8 hours.  You develop shortness of breath.  You notice blood in your stool or vomit. This may look like coffee grounds.  You have abdominal pain that increases or is concentrated in one small area (localized).  You have persistent vomiting or diarrhea.  You have a fever.  The patient is a child younger than 3 months, and he or she has a fever.  The patient is a child older than 3 months, and he or she has a fever and persistent symptoms.  The patient is a child older than 3 months, and he or she has a fever and symptoms suddenly get worse.  The patient is a baby, and he or she has no tears when crying. MAKE SURE YOU:   Understand these instructions.  Will watch your condition.  Will get help right away if  you are not doing well or get worse.   This information is not intended to replace advice given to you by your health care provider. Make sure you discuss any questions you have with your health care provider.   Document Released: 11/07/2005 Document Revised: 01/30/2012 Document Reviewed: 08/24/2011 Elsevier Interactive Patient Education [employer].

## 2015-12-18 ENCOUNTER — Telehealth: Payer: Self-pay | Admitting: Family Medicine

## 2015-12-18 NOTE — Telephone Encounter (Signed)
Patient stopped by to get lab results and to pick up a hard copy

## 2015-12-18 NOTE — Telephone Encounter (Signed)
Called patient Kara Richmond has not came in yet.

## 2015-12-19 LAB — COMPREHENSIVE METABOLIC PANEL
ALT: 13 IU/L (ref 0–32)
AST: 17 IU/L (ref 0–40)
Albumin/Globulin Ratio: 1.3 (ref 1.1–2.5)
Albumin: 4 g/dL (ref 3.5–5.5)
Alkaline Phosphatase: 56 IU/L (ref 39–117)
BUN/Creatinine Ratio: 12 (ref 9–23)
BUN: 11 mg/dL (ref 6–24)
Bilirubin Total: 0.2 mg/dL (ref 0.0–1.2)
CO2: 19 mmol/L (ref 18–29)
Calcium: 8.7 mg/dL (ref 8.7–10.2)
Chloride: 98 mmol/L (ref 96–106)
Creatinine, Ser: 0.91 mg/dL (ref 0.57–1.00)
GFR calc Af Amer: 89 mL/min/{1.73_m2} (ref >59)
GFR calc non Af Amer: 78 mL/min/{1.73_m2} (ref >59)
Globulin, Total: 3.1 g/dL (ref 1.5–4.5)
Glucose: 110 mg/dL — ABNORMAL HIGH (ref 65–99)
Potassium: 3.3 mmol/L — ABNORMAL LOW (ref 3.5–5.2)
Sodium: 135 mmol/L (ref 134–144)
Total Protein: 7.1 g/dL (ref 6.0–8.5)

## 2015-12-19 LAB — CBC WITH DIFFERENTIAL/PLATELET
Basophils Absolute: 0 10*3/uL (ref 0.0–0.2)
Basos: 1 %
EOS (ABSOLUTE): 0 10*3/uL (ref 0.0–0.4)
Eos: 1 %
Hematocrit: 38.8 % (ref 34.0–46.6)
Hemoglobin: 12.6 g/dL (ref 11.1–15.9)
Immature Grans (Abs): 0 10*3/uL (ref 0.0–0.1)
Immature Granulocytes: 0 %
Lymphocytes Absolute: 1.8 10*3/uL (ref 0.7–3.1)
Lymphs: 43 %
MCH: 23.9 pg — ABNORMAL LOW (ref 26.6–33.0)
MCHC: 32.5 g/dL (ref 31.5–35.7)
MCV: 74 fL — ABNORMAL LOW (ref 79–97)
Monocytes Absolute: 0.5 10*3/uL (ref 0.1–0.9)
Monocytes: 11 %
Neutrophils Absolute: 1.9 10*3/uL (ref 1.4–7.0)
Neutrophils: 44 %
Platelets: 397 10*3/uL — ABNORMAL HIGH (ref 150–379)
RBC: 5.28 x10E6/uL (ref 3.77–5.28)
RDW: 16.5 % — ABNORMAL HIGH (ref 12.3–15.4)
WBC: 4.3 10*3/uL (ref 3.4–10.8)

## 2015-12-19 LAB — QUANTIFERON IN TUBE
QFT TB AG MINUS NIL VALUE: 0 IU/mL
QUANTIFERON MITOGEN VALUE: 5.12 IU/mL
QUANTIFERON TB AG VALUE: 0.05 IU/mL
QUANTIFERON TB GOLD: NEGATIVE
Quantiferon Nil Value: 0.05 IU/mL

## 2015-12-19 LAB — QUANTIFERON TB GOLD ASSAY (BLOOD)

## 2016-02-09 ENCOUNTER — Other Ambulatory Visit: Payer: Self-pay | Admitting: Family Medicine

## 2016-02-09 DIAGNOSIS — Z1231 Encounter for screening mammogram for malignant neoplasm of breast: Secondary | ICD-10-CM

## 2016-02-11 ENCOUNTER — Ambulatory Visit (INDEPENDENT_AMBULATORY_CARE_PROVIDER_SITE_OTHER): Payer: 59 | Admitting: Family Medicine

## 2016-02-11 ENCOUNTER — Encounter: Payer: Self-pay | Admitting: Family Medicine

## 2016-02-11 VITALS — BP 122/68 | HR 99 | Temp 97.9°F | Resp 16 | Ht 64.0 in | Wt 250.4 lb

## 2016-02-11 DIAGNOSIS — E119 Type 2 diabetes mellitus without complications: Secondary | ICD-10-CM

## 2016-02-11 DIAGNOSIS — Z9889 Other specified postprocedural states: Secondary | ICD-10-CM | POA: Diagnosis not present

## 2016-02-11 DIAGNOSIS — F329 Major depressive disorder, single episode, unspecified: Secondary | ICD-10-CM

## 2016-02-11 DIAGNOSIS — I10 Essential (primary) hypertension: Secondary | ICD-10-CM

## 2016-02-11 DIAGNOSIS — F419 Anxiety disorder, unspecified: Secondary | ICD-10-CM

## 2016-02-11 DIAGNOSIS — F32A Depression, unspecified: Secondary | ICD-10-CM

## 2016-02-11 DIAGNOSIS — Z124 Encounter for screening for malignant neoplasm of cervix: Secondary | ICD-10-CM | POA: Diagnosis not present

## 2016-02-11 DIAGNOSIS — Z9884 Bariatric surgery status: Secondary | ICD-10-CM

## 2016-02-11 DIAGNOSIS — Z79899 Other long term (current) drug therapy: Secondary | ICD-10-CM | POA: Diagnosis not present

## 2016-02-11 DIAGNOSIS — F418 Other specified anxiety disorders: Secondary | ICD-10-CM

## 2016-02-11 DIAGNOSIS — E785 Hyperlipidemia, unspecified: Secondary | ICD-10-CM

## 2016-02-11 DIAGNOSIS — E559 Vitamin D deficiency, unspecified: Secondary | ICD-10-CM

## 2016-02-11 DIAGNOSIS — Z1239 Encounter for other screening for malignant neoplasm of breast: Secondary | ICD-10-CM | POA: Diagnosis not present

## 2016-02-11 DIAGNOSIS — Z0000 Encounter for general adult medical examination without abnormal findings: Secondary | ICD-10-CM

## 2016-02-11 DIAGNOSIS — Z01419 Encounter for gynecological examination (general) (routine) without abnormal findings: Secondary | ICD-10-CM

## 2016-02-11 MED ORDER — CITALOPRAM HYDROBROMIDE 40 MG PO TABS: 40.0000 mg | ORAL_TABLET | Freq: Every day | ORAL | Status: DC

## 2016-02-11 MED ORDER — ALPRAZOLAM 0.5 MG PO TABS: 0.5000 mg | ORAL_TABLET | Freq: Every evening | ORAL | Status: DC | PRN

## 2016-02-11 MED ORDER — LISINOPRIL-HYDROCHLOROTHIAZIDE 10-12.5 MG PO TABS: 1.0000 | ORAL_TABLET | Freq: Every day | ORAL | Status: DC

## 2016-02-11 NOTE — Progress Notes (Signed)
Name: Kara Richmond   MRN: [id number]    DOB: [birth year]   Date:02/11/2016       Progress Note  Subjective  Chief Complaint  Chief Complaint  Patient presents with  . Annual Exam    patient had labs on 12/16/15  . Obesity    HPI  Well Woman: she has not been sexually active for one year, vaginal discharge, she has urinary urgency - but not severe at this time  DMII: always diet controlled, she is due for eye exam, no checking glucose at home. She has episodes of hypoglycemia when she skips meals. No polyphagia, polydipsia or polyuria  HTN: taking medication, states no cough association with ACE,. No chest pain, no palpitation.   Anxiety/Depression: she was diagnosed with depression about 6 years ago, she has been out of her parents house for the past three years. She states she no longer has anhedonia, or suicidal thoughts, she does not want to stop medication.   Obesity: she had bariatric surgery 2006, but she only lost 35 lbs but she stopped losing after that. She states maximum weight was 263 lbs, and she still below that value. She is trying to stay active.    Patient Active Problem List   Diagnosis Date Noted  . Diet-controlled type 2 diabetes mellitus ([place]) 09/02/2015  . Mild tricuspid regurgitation 09/02/2015  . Allergic rhinitis 06/08/2015  . Chronic osteoarthritis 06/08/2015  . Controlled insomnia 06/08/2015  . Anxiety and depression 06/08/2015  . Dyslipidemia 06/08/2015  . Eustachian tube disorder 06/08/2015  . History of HPV infection 06/08/2015  . Lymphedema 06/08/2015  . Premenstrual tension syndrome 06/08/2015  . Vitamin D deficiency 06/08/2015  . Essential hypertension 09/19/2014  . Aortic valve disorder 01/05/2010  . 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 Extreme obesity ([place]) 02/12/2008  . History of gastric bypass 09/17/2005  . H/O deep venous thrombosis 01/20/2004    Past Surgical History  Procedure Laterality Date  . Cholecystectomy    .  Gastric bypass      Family History  Problem Relation Age of Onset  . Hypertension Mother   . Diabetes Mother   . CVA Mother   . Hypertension Father   . Hypertension Sister   . Diabetes Sister     Social History   Social History  . Marital Status: Single    Spouse Name: N/A  . Number of Children: N/A  . Years of Education: N/A   Occupational History  . Not on file.   Social History Main Topics  . Smoking status: Never Smoker   . Smokeless tobacco: Never Used  . Alcohol Use: No  . Drug Use: No  . Sexual Activity: Not Currently   Other Topics Concern  . Not on file   Social History Narrative     Current outpatient prescriptions:  .  ALPRAZolam (XANAX) 0.5 MG tablet, Take 1 tablet (0.5 mg total) by mouth at bedtime as needed for anxiety., Disp: 30 tablet, Rfl: 0 .  aspirin 81 MG tablet, Take 81 mg by mouth daily., Disp: , Rfl:  .  Calcium-Vitamin D-Vitamin K ([facility] CALCIUM SOFT CHEWS) [id number] MG-UNT-MCG CHEW, Chew 2 tablets by mouth 2 (two) times daily., Disp: , Rfl:  .  Cholecalciferol (VITAMIN D) 2000 UNITS tablet, Take 2,000 Units by mouth daily., Disp: , Rfl:  .  citalopram (CELEXA) 40 MG tablet, Take 1 tablet (40 mg total) by mouth daily., Disp: 90 tablet, Rfl: 1 .  levonorgestrel (MIRENA) 20 MCG/24HR IUD, 1  each by Intrauterine route once., Disp: , Rfl:  .  lisinopril-hydrochlorothiazide (PRINZIDE,ZESTORETIC) 10-12.5 MG tablet, Take 1 tablet by mouth daily., Disp: 90 tablet, Rfl: 1 .  MAGNESIUM GLUCONATE PO, Take 500 mg by mouth 2 (two) times daily., Disp: , Rfl:  .  Multiple Vitamin (MULTIVITAMIN) tablet, Take 1 tablet by mouth daily., Disp: , Rfl:  .  Zinc Gluconate 100 MG TABS, Take by mouth daily., Disp: , Rfl:  .  Bismuth Subsalicylate (PEPTO-BISMOL PO), Take by mouth as needed. Reported on 02/11/2016, Disp: , Rfl:  .  EPINEPHrine (EPIPEN 2-PAK) 0.3 mg/0.3 mL IJ SOAJ injection, Inject 0.3 mg into the muscle once. Reported on 02/11/2016, Disp: , Rfl:   No  Known Allergies   ROS  Constitutional: Negative for fever or weight change.  Respiratory: Negative for cough and mild  shortness of breath with activity.   Cardiovascular: Negative for chest pain or palpitations.  Gastrointestinal: Negative for abdominal pain, no bowel changes.  Musculoskeletal: Negative for gait problem or joint swelling.  Skin: Negative for rash.  Neurological: Negative for dizziness or headache.  No other specific complaints in a complete review of systems (except as listed in HPI above).  Objective  Filed Vitals:   02/11/16 0823  BP: 122/68  Pulse: 99  Temp: 97.9 F (36.6 C)  TempSrc: Oral  Resp: 16  Height: 5' 4"  (1.626 m)  Weight: 250 lb 6.4 oz (113.581 kg)  SpO2: 95%    Body mass index is 42.96 kg/(m^2).  Physical Exam  Constitutional: Patient appears well-developed and obese. No distress.  HENT: Head: Normocephalic and atraumatic. Ears: B TMs ok, no erythema or effusion; Nose: Nose normal. Mouth/Throat: Oropharynx is clear and moist. No oropharyngeal exudate.  Eyes: Conjunctivae and EOM are normal. Pupils are equal, round, and reactive to light. No scleral icterus.  Neck: Normal range of motion. Neck supple. No JVD present. No thyromegaly present.  Cardiovascular: Normal rate, regular rhythm and normal heart sounds.  No murmur heard. No BLE edema. Pulmonary/Chest: Effort normal and breath sounds normal. No respiratory distress. Abdominal: Soft. Bowel sounds are normal, no distension. There is no tenderness. no masses Breast: no lumps or masses, no nipple discharge or rashes FEMALE GENITALIA:  External genitalia normal External urethra normal Vaginal vault normal without discharge or lesions Cervix normal without discharge or lesions Bimanual exam normal without masses RECTAL: not done Musculoskeletal: Normal range of motion, no joint effusions. No gross deformities Neurological: he is alert and oriented to person, place, and time. No cranial  nerve deficit. Coordination, balance, strength, speech and gait are normal.  Skin: Skin is warm and dry. No rash noted. No erythema.  Psychiatric: Patient has a normal mood and affect. behavior is normal. Judgment and thought content normal.  Recent Results (from the past 2160 hour(s))  CBC with Differential/Platelet     Status: Abnormal   Collection Time: 12/16/15 10:09 AM  Result Value Ref Range   WBC 4.3 3.4 - 10.8 x10E3/uL   RBC 5.28 3.77 - 5.28 x10E6/uL   Hemoglobin 12.6 11.1 - 15.9 g/dL   Hematocrit 38.8 34.0 - 46.6 %   MCV 74 (L) 79 - 97 fL   MCH 23.9 (L) 26.6 - 33.0 pg   MCHC 32.5 31.5 - 35.7 g/dL   RDW 16.5 (H) 12.3 - 15.4 %   Platelets 397 (H) 150 - 379 x10E3/uL   Neutrophils 44 %   Lymphs 43 %   Monocytes 11 %   Eos 1 %  Basos 1 %   Neutrophils Absolute 1.9 1.4 - 7.0 x10E3/uL   Lymphocytes Absolute 1.8 0.7 - 3.1 x10E3/uL   Monocytes Absolute 0.5 0.1 - 0.9 x10E3/uL   EOS (ABSOLUTE) 0.0 0.0 - 0.4 x10E3/uL   Basophils Absolute 0.0 0.0 - 0.2 x10E3/uL   Immature Granulocytes 0 %   Immature Grans (Abs) 0.0 0.0 - 0.1 x10E3/uL  Comprehensive metabolic panel     Status: Abnormal   Collection Time: 12/16/15 10:09 AM  Result Value Ref Range   Glucose 110 (H) 65 - 99 mg/dL   BUN 11 6 - 24 mg/dL   Creatinine, Ser 0.91 0.57 - 1.00 mg/dL   GFR calc non Af Amer 78 >59 mL/min/1.73   GFR calc Af Amer 89 >59 mL/min/1.73   BUN/Creatinine Ratio 12 9 - 23   Sodium 135 134 - 144 mmol/L   Potassium 3.3 (L) 3.5 - 5.2 mmol/L   Chloride 98 96 - 106 mmol/L   CO2 19 18 - 29 mmol/L   Calcium 8.7 8.7 - 10.2 mg/dL   Total Protein 7.1 6.0 - 8.5 g/dL   Albumin 4.0 3.5 - 5.5 g/dL   Globulin, Total 3.1 1.5 - 4.5 g/dL   Albumin/Globulin Ratio 1.3 1.1 - 2.5   Bilirubin Total 0.2 0.0 - 1.2 mg/dL   Alkaline Phosphatase 56 39 - 117 IU/L   AST 17 0 - 40 IU/L   ALT 13 0 - 32 IU/L  Quantiferon tb gold assay (blood)     Status: None   Collection Time: 12/16/15 10:09 AM  Result Value Ref Range    QUANTIFERON INCUBATION Comment     Comment: Specimen incubated at [place], [facility] [place], [place].  QuantiFERON In Tube     Status: None   Collection Time: 12/16/15 10:09 AM  Result Value Ref Range   QUANTIFERON TB GOLD Negative Negative   QUANTIFERON CRITERIA Comment     Comment: To be considered positive a specimen should have a TB Ag minus Nil value greater than or equal to 0.35 IU/mL and in addition the TB Ag minus Nil value must be greater than or equal to 25% of the Nil value. There may be insufficient information in these values to differentiate between some negative and some indeterminate test values.    QUANTIFERON TB AG VALUE 0.05 IU/mL   Quantiferon Nil Value 0.05 IU/mL   QUANTIFERON MITOGEN VALUE 5.12 IU/mL   QFT TB AG MINUS NIL VALUE 0.00 IU/mL   Interpretation: Comment     Comment: The QuantiFERON TB Gold (in Tube) assay is intended for use as an aid in the diagnosis of TB infection. Negative results suggest that there is no TB infection. In patients with high suspicion of exposure, a negative test should be repeated. A positive test indicates infection with Mycobacterium tuberculosis. Among individuals without tuberculosis infection, a positive test may be due to exposure to [place], M. szulgai or M. marinum. On the Internet, go to https://figueroa-lambert.info/ for further details.      PHQ2/9: Depression screen [facility] 2/9 02/11/2016 12/16/2015 09/02/2015 06/30/2015  Decreased Interest 0 0 0 0  Down, Depressed, Hopeless 0 0 0 0  PHQ - 2 Score 0 0 0 0     Fall Risk: Fall Risk  02/11/2016 12/16/2015 09/02/2015 06/30/2015  Falls in the past year? No No Yes Yes  Number falls in past yr: - - 1 1  Injury with Fall? - - No No      Functional Status Survey: Is the patient  deaf or have difficulty hearing?: No Does the patient have difficulty seeing, even when wearing glasses/contacts?: No Does the patient have difficulty concentrating, remembering, or making decisions?: No Does the patient  have difficulty walking or climbing stairs?: No Does the patient have difficulty dressing or bathing?: No Does the patient have difficulty doing errands alone such as visiting a doctor's office or shopping?: No    Assessment & Plan  1. Well woman exam  Discussed importance of 150 minutes of physical activity weekly, eat two servings of fish weekly, eat one serving of tree nuts ( cashews, pistachios, pecans, almonds.Marland Kitchen) every other day, eat 6 servings of fruit/vegetables daily and drink plenty of water and avoid sweet beverages.   2. Vitamin D deficiency  - VITAMIN D 25 Hydroxy (Vit-D Deficiency, Fractures)  3. Dyslipidemia  - Lipid panel  4. History of gastric bypass  - Vitamin B12 - VITAMIN D 25 Hydroxy (Vit-D Deficiency, Fractures) - Ferritin - CBC with Differential/Platelet  5. Diet-controlled type 2 diabetes mellitus (HCC)  - Hemoglobin A1c  6. Long-term use of high-risk medication  - Comprehensive metabolic panel  7. Essential hypertension  - lisinopril-hydrochlorothiazide (PRINZIDE,ZESTORETIC) 10-12.5 MG tablet; Take 1 tablet by mouth daily.  Dispense: 90 tablet; Refill: 1  8. Anxiety and depression  - ALPRAZolam (XANAX) 0.5 MG tablet; Take 1 tablet (0.5 mg total) by mouth at bedtime as needed for anxiety.  Dispense: 30 tablet; Refill: 0 - citalopram (CELEXA) 40 MG tablet; Take 1 tablet (40 mg total) by mouth daily.  Dispense: 90 tablet; Refill: 1  9. Morbid obesity, unspecified obesity type [facility])  Discussed with the patient the risk posed by an increased BMI. Discussed importance of portion control, calorie counting and at least 150 minutes of physical activity weekly. Avoid sweet beverages and drink more water. Eat at least 6 servings of fruit and vegetables daily   10. Cervical cancer screening  - PapLb, HPV, rfx16/18  11. Breast cancer screening  Already has an appointment

## 2016-02-12 LAB — COMPREHENSIVE METABOLIC PANEL
ALT: 10 IU/L (ref 0–32)
AST: 16 IU/L (ref 0–40)
Albumin/Globulin Ratio: 1.2 (ref 1.2–2.2)
Albumin: 3.8 g/dL (ref 3.5–5.5)
Alkaline Phosphatase: 55 IU/L (ref 39–117)
BUN/Creatinine Ratio: 20 (ref 9–23)
BUN: 18 mg/dL (ref 6–24)
Bilirubin Total: 0.3 mg/dL (ref 0.0–1.2)
CO2: 21 mmol/L (ref 18–29)
Calcium: 9.1 mg/dL (ref 8.7–10.2)
Chloride: 101 mmol/L (ref 96–106)
Creatinine, Ser: 0.91 mg/dL (ref 0.57–1.00)
GFR calc Af Amer: 89 mL/min/{1.73_m2} (ref >59)
GFR calc non Af Amer: 78 mL/min/{1.73_m2} (ref >59)
Globulin, Total: 3.3 g/dL (ref 1.5–4.5)
Glucose: 82 mg/dL (ref 65–99)
Potassium: 3.8 mmol/L (ref 3.5–5.2)
Sodium: 140 mmol/L (ref 134–144)
Total Protein: 7.1 g/dL (ref 6.0–8.5)

## 2016-02-12 LAB — CBC WITH DIFFERENTIAL/PLATELET
Basophils Absolute: 0 10*3/uL (ref 0.0–0.2)
Basos: 1 %
EOS (ABSOLUTE): 0.1 10*3/uL (ref 0.0–0.4)
Eos: 1 %
Hematocrit: 37.2 % (ref 34.0–46.6)
Hemoglobin: 12 g/dL (ref 11.1–15.9)
Immature Grans (Abs): 0 10*3/uL (ref 0.0–0.1)
Immature Granulocytes: 0 %
Lymphocytes Absolute: 3.3 10*3/uL — ABNORMAL HIGH (ref 0.7–3.1)
Lymphs: 55 %
MCH: 23.7 pg — ABNORMAL LOW (ref 26.6–33.0)
MCHC: 32.3 g/dL (ref 31.5–35.7)
MCV: 73 fL — ABNORMAL LOW (ref 79–97)
Monocytes Absolute: 0.6 10*3/uL (ref 0.1–0.9)
Monocytes: 11 %
Neutrophils Absolute: 1.9 10*3/uL (ref 1.4–7.0)
Neutrophils: 32 %
Platelets: 472 10*3/uL — ABNORMAL HIGH (ref 150–379)
RBC: 5.07 x10E6/uL (ref 3.77–5.28)
RDW: 16.6 % — ABNORMAL HIGH (ref 12.3–15.4)
WBC: 6 10*3/uL (ref 3.4–10.8)

## 2016-02-12 LAB — LIPID PANEL
Chol/HDL Ratio: 4.5 ratio units — ABNORMAL HIGH (ref 0.0–4.4)
Cholesterol, Total: 199 mg/dL (ref 100–199)
HDL: 44 mg/dL (ref >39)
LDL Calculated: 131 mg/dL — ABNORMAL HIGH (ref 0–99)
Triglycerides: 119 mg/dL (ref 0–149)
VLDL Cholesterol Cal: 24 mg/dL (ref 5–40)

## 2016-02-12 LAB — FERRITIN: Ferritin: 39 ng/mL (ref 15–150)

## 2016-02-12 LAB — HEMOGLOBIN A1C
Est. average glucose Bld gHb Est-mCnc: 140 mg/dL
Hgb A1c MFr Bld: 6.5 % — ABNORMAL HIGH (ref 4.8–5.6)

## 2016-02-12 LAB — VITAMIN B12: Vitamin B-12: 787 pg/mL (ref 211–946)

## 2016-02-12 LAB — VITAMIN D 25 HYDROXY (VIT D DEFICIENCY, FRACTURES): Vit D, 25-Hydroxy: 31.9 ng/mL (ref 30.0–100.0)

## 2016-02-14 LAB — PAPLB, HPV, RFX16/18
HPV, high-risk: NEGATIVE
PAP Smear Comment: 0

## 2016-02-22 ENCOUNTER — Ambulatory Visit
Admission: RE | Admit: 2016-02-22 | Discharge: 2016-02-22 | Disposition: A | Discharge status: Home / Self Care | Payer: 59 | Source: Ambulatory Visit | Attending: Family Medicine | Admitting: Family Medicine

## 2016-02-22 DIAGNOSIS — Z1231 Encounter for screening mammogram for malignant neoplasm of breast: Secondary | ICD-10-CM | POA: Diagnosis not present

## 2016-04-13 ENCOUNTER — Telehealth: Payer: Self-pay

## 2016-04-13 DIAGNOSIS — F329 Major depressive disorder, single episode, unspecified: Secondary | ICD-10-CM

## 2016-04-13 DIAGNOSIS — F419 Anxiety disorder, unspecified: Principal | ICD-10-CM

## 2016-04-13 DIAGNOSIS — F32A Depression, unspecified: Secondary | ICD-10-CM

## 2016-04-13 NOTE — Telephone Encounter (Signed)
Patient called stating she is super stressed with her work load and needs a increase in her anxiety medication. I informed Dr. Ancil Boozer verbally and she stated patient is on the highest dose of Celexa and is on Xanax as well, but the patient would have to come in to discuss medication changes since she is having increased anxiety. Our first available appointment is not until the second week in June and patient wanted a change now due to her nerves. Patient was informed to go to the Emergency Dept if she felt suicidal, but she denies being suicidial due to her daughter and having to care for her. Patient was also notified to call [facility] for the Crisis Services to be seen sooner for mediation and therapy.
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
## 2016-04-14 NOTE — Telephone Encounter (Signed)
Please call patient to see if she went to [facility]

## 2016-05-09 ENCOUNTER — Emergency Department
Admission: EM | Admit: 2016-05-09 | Discharge: 2016-05-09 | Disposition: A | Discharge status: Home / Self Care | Payer: 59 | Attending: Emergency Medicine | Admitting: Emergency Medicine

## 2016-05-09 ENCOUNTER — Encounter: Payer: Self-pay | Admitting: Emergency Medicine

## 2016-05-09 ENCOUNTER — Emergency Department: Payer: 59

## 2016-05-09 DIAGNOSIS — Z7982 Long term (current) use of aspirin: Secondary | ICD-10-CM | POA: Insufficient documentation

## 2016-05-09 DIAGNOSIS — Z79899 Other long term (current) drug therapy: Secondary | ICD-10-CM | POA: Insufficient documentation

## 2016-05-09 DIAGNOSIS — Y999 Unspecified external cause status: Secondary | ICD-10-CM | POA: Insufficient documentation

## 2016-05-09 DIAGNOSIS — S8001XA Contusion of right knee, initial encounter: Secondary | ICD-10-CM | POA: Insufficient documentation

## 2016-05-09 DIAGNOSIS — M1990 Unspecified osteoarthritis, unspecified site: Secondary | ICD-10-CM | POA: Insufficient documentation

## 2016-05-09 DIAGNOSIS — Y92512 Supermarket, store or market as the place of occurrence of the external cause: Secondary | ICD-10-CM | POA: Insufficient documentation

## 2016-05-09 DIAGNOSIS — W010XXA Fall on same level from slipping, tripping and stumbling without subsequent striking against object, initial encounter: Secondary | ICD-10-CM | POA: Insufficient documentation

## 2016-05-09 DIAGNOSIS — E785 Hyperlipidemia, unspecified: Secondary | ICD-10-CM | POA: Insufficient documentation

## 2016-05-09 DIAGNOSIS — I10 Essential (primary) hypertension: Secondary | ICD-10-CM | POA: Insufficient documentation

## 2016-05-09 DIAGNOSIS — E119 Type 2 diabetes mellitus without complications: Secondary | ICD-10-CM | POA: Insufficient documentation

## 2016-05-09 DIAGNOSIS — Y939 Activity, unspecified: Secondary | ICD-10-CM | POA: Insufficient documentation

## 2016-05-09 MED ORDER — CYCLOBENZAPRINE HCL 5 MG PO TABS: 5.0000 mg | ORAL_TABLET | Freq: Three times a day (TID) | ORAL | Status: DC | PRN

## 2016-05-09 MED ORDER — NAPROXEN 500 MG PO TBEC: 500.0000 mg | DELAYED_RELEASE_TABLET | Freq: Two times a day (BID) | ORAL | Status: DC

## 2016-05-09 NOTE — ED Notes (Signed)
Slipped and fell yesterday and injured rt knee. Pain worsens with ambulation.

## 2016-05-09 NOTE — Discharge Instructions (Signed)
Cryotherapy Cryotherapy is when you put ice on your injury. Ice helps lessen pain and puffiness (swelling) after an injury. Ice works the best when you start using it in the first 24 to 48 hours after an injury. HOME CARE  Put a dry or damp towel between the ice pack and your skin.  You may press gently on the ice pack.  Leave the ice on for no more than 10 to 20 minutes at a time.  Check your skin after 5 minutes to make sure your skin is okay.  Rest at least 20 minutes between ice pack uses.  Stop using ice when your skin loses feeling (numbness).  Do not use ice on someone who cannot tell you when it hurts. This includes small children and people with memory problems (dementia). GET HELP RIGHT AWAY IF:  You have white spots on your skin.  Your skin turns blue or pale.  Your skin feels waxy or hard.  Your puffiness gets worse. MAKE SURE YOU:   Understand these instructions.  Will watch your condition.  Will get help right away if you are not doing well or get worse.   This information is not intended to replace advice given to you by your health care provider. Make sure you discuss any questions you have with your health care provider.   Document Released: 04/25/2008 Document Revised: 01/30/2012 Document Reviewed: 06/30/2011 Elsevier Interactive Patient Education 2016 [place] A contusion is a deep bruise. Contusions happen when an injury causes bleeding under the skin. Symptoms of bruising include pain, swelling, and discolored skin. The skin may turn blue, purple, or yellow. HOME CARE   Rest the injured area.  If told, put ice on the injured area.  Put ice in a plastic bag.  Place a towel between your skin and the bag.  Leave the ice on for 20 minutes, 2-3 times per day.  If told, put light pressure (compression) on the injured area using an elastic bandage. Make sure the bandage is not too tight. Remove it and put it back on as told by your  doctor.  If possible, raise (elevate) the injured area above the level of your heart while you are sitting or lying down.  Take over-the-counter and prescription medicines only as told by your doctor. GET HELP IF:  Your symptoms do not get better after several days of treatment.  Your symptoms get worse.  You have trouble moving the injured area. GET HELP RIGHT AWAY IF:   You have very bad pain.  You have a loss of feeling (numbness) in a hand or foot.  Your hand or foot turns pale or cold.   This information is not intended to replace advice given to you by your health care provider. Make sure you discuss any questions you have with your health care provider.   Document Released: 04/25/2008 Document Revised: 07/29/2015 Document Reviewed: 03/25/2015 Elsevier Interactive Patient Education 2016 [employer].   Your exam and x-ray are negative for any bony fracture or dislocation. You have soft tissue pain and swelling consistent with a contusion due to your fall. Follow-up with Dr. Ancil Boozer as needed. Take the prescription meds as needed.

## 2016-05-09 NOTE — ED Provider Notes (Signed)
[facility] Emergency Department Provider Note ____________________________________________  Time seen: 1506  I have reviewed the triage vital signs and the nursing notes.  HISTORY  Chief Complaint  Knee Injury  HPI Kara Richmond is a 45 y.o. female presents to the ED for evaluation of pain to the right knee. Patient describes onset yesterday after she slipped and fell in a local thrift store due to a wet floor. She describes falling onto her right knee primarily. She is here for herself at home yesterday with Tylenol and range of motion exercises. She presents today with pain primarily to the medial aspect of the tibia and reports areas being tender to touch. She denies any other injury at this time. She reports her pain at a 4/10 in triage, and describes the pain as aching.  Past Medical History  Diagnosis Date  . Anxiety and depression   . Hypertension   . Chronic osteoarthritis   . Diabetes mellitus without complication ([place])   . Controlled insomnia   . Dyslipidemia   . Lymphedema of leg   . Muscle spasm   . Vitamin D deficiency   . PMT (premenstrual tension)   . AR (allergic rhinitis)   . Hx of deep vein thrombophlebitis of lower extremity   . Morbid obesity ([place])   . Mild aortic regurgitation   . Heart murmur   . Hyperlipidemia   . Clotting disorder ([place])     x2 lung; x1 left leg    Patient Active Problem List   Diagnosis Date Noted  . Diet-controlled type 2 diabetes mellitus ([place]) 09/02/2015  . Mild tricuspid regurgitation 09/02/2015  . Allergic rhinitis 06/08/2015  . Chronic osteoarthritis 06/08/2015  . Controlled insomnia 06/08/2015  . Anxiety and depression 06/08/2015  . Dyslipidemia 06/08/2015  . Eustachian tube disorder 06/08/2015  . History of HPV infection 06/08/2015  . Lymphedema 06/08/2015  . Premenstrual tension syndrome 06/08/2015  . Vitamin D deficiency 06/08/2015  . Essential hypertension 09/19/2014  . Aortic valve  disorder 01/05/2010  . 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 Extreme obesity ([place]) 02/12/2008  . History of gastric bypass 09/17/2005  . H/O deep venous thrombosis 01/20/2004    Past Surgical History  Procedure Laterality Date  . Cholecystectomy    . Gastric bypass      Current Outpatient Rx  Name  Route  Sig  Dispense  Refill  . ALPRAZolam (XANAX) 0.5 MG tablet   Oral   Take 1 tablet (0.5 mg total) by mouth at bedtime as needed for anxiety.   30 tablet   0   . aspirin 81 MG tablet   Oral   Take 81 mg by mouth daily.         . Bismuth Subsalicylate (PEPTO-BISMOL PO)   Oral   Take by mouth as needed. Reported on 02/11/2016         . Calcium-Vitamin D-Vitamin K ([facility] CALCIUM SOFT CHEWS) [id number] MG-UNT-MCG CHEW   Oral   Chew 2 tablets by mouth 2 (two) times daily.         . Cholecalciferol (VITAMIN D) 2000 UNITS tablet   Oral   Take 2,000 Units by mouth daily.         . citalopram (CELEXA) 40 MG tablet   Oral   Take 1 tablet (40 mg total) by mouth daily.   90 tablet   1   . cyclobenzaprine (FLEXERIL) 5 MG tablet   Oral   Take 1 tablet (5 mg total)  by mouth 3 (three) times daily as needed for muscle spasms.   15 tablet   0   . EPINEPHrine (EPIPEN 2-PAK) 0.3 mg/0.3 mL IJ SOAJ injection   Intramuscular   Inject 0.3 mg into the muscle once. Reported on 02/11/2016         . levonorgestrel (MIRENA) 20 MCG/24HR IUD   Intrauterine   1 each by Intrauterine route once.         Marland Kitchen lisinopril-hydrochlorothiazide (PRINZIDE,ZESTORETIC) 10-12.5 MG tablet   Oral   Take 1 tablet by mouth daily.   90 tablet   1   . MAGNESIUM GLUCONATE PO   Oral   Take 500 mg by mouth 2 (two) times daily.         . Multiple Vitamin (MULTIVITAMIN) tablet   Oral   Take 1 tablet by mouth daily.         . naproxen (EC NAPROSYN) 500 MG EC tablet   Oral   Take 1 tablet (500 mg total) by mouth 2 (two) times daily with a meal.   30 tablet   0   . Zinc Gluconate 100  MG TABS   Oral   Take by mouth daily.           Allergies Review of patient's allergies indicates no known allergies.  Family History  Problem Relation Age of Onset  . Hypertension Mother   . Diabetes Mother   . CVA Mother   . Hypertension Father   . Hypertension Sister   . Diabetes Sister     Social History Social History  Substance Use Topics  . Smoking status: Never Smoker   . Smokeless tobacco: Never Used  . Alcohol Use: No   Review of Systems  Constitutional: Negative for fever. Musculoskeletal: Negative for back pain. Right knee pain as above Skin: Negative for rash. Neurological: Negative for headaches, focal weakness or numbness. ____________________________________________  PHYSICAL EXAM:  VITAL SIGNS: ED Triage Vitals  Enc Vitals Group     BP 05/09/16 1416 114/75 mmHg     Pulse Rate 05/09/16 1416 78     Resp 05/09/16 1416 18     Temp 05/09/16 1416 98.3 F (36.8 C)     Temp Source 05/09/16 1416 Oral     SpO2 05/09/16 1416 100 %     Weight 05/09/16 1416 340 lb (154.223 kg)     Height 05/09/16 1416 5' 4"  (1.626 m)     Head Cir --      Peak Flow --      Pain Score 05/09/16 1424 4     Pain Loc --      Pain Edu? --      Excl. in [place]? --    Constitutional: Alert and oriented. Well appearing and in no distress. Head: Normocephalic and atraumatic. Cardiovascular: Normal rate, regular rhythm.  Respiratory: Normal respiratory effort. No wheezes/rales/rhonchi. Musculoskeletal: Right knee without obvious deformity, effusion, or dislocation. Patient with normal right knee ROM without laxity, crepitus, or disability. Patient is tender to palp over the proximal, medial tibia. No calf, achilles, or popliteal space tenderness. Nontender with normal range of motion in all other extremities.  Neurologic:  Normal gait without ataxia. Normal speech and language. No gross focal neurologic deficits are appreciated. Skin:  Skin is warm, dry and intact. No rash  noted. ____________________________________________   RADIOLOGY  Right Knee IMPRESSION: Degenerative change, without acute osseous finding.  I, Reuel Lamadrid, Dannielle Karvonen, personally viewed and evaluated these images (plain radiographs)  as part of my medical decision making, as well as reviewing the written report by the radiologist. ____________________________________________  INITIAL IMPRESSION / [place] / ED COURSE  With acute right knee pain secondary to contusion following a mechanical fall. X-ray is reassuring for no acute fracture or dislocation. She does have pre-existing tricompartmental degenerative changes. Otherwise exam is consistent with a contusion to the knee. She'll be discharged with prescription for EC Naprosyn and Flexeril doses directed. She is encouraged to apply ice to reduce pain and discomfort to the medial aspect of the proximal tibia. She will follow up with Dr. Ancil Boozer for ongoing symptom management. ____________________________________________  FINAL CLINICAL IMPRESSION(S) / ED DIAGNOSES  Final diagnoses:  Fall from slip, trip, or stumble, initial encounter  Knee contusion, right, initial encounter     Melvenia Needles, PA-C 05/09/16 [place], MD 05/10/16 [phone]

## 2016-05-09 NOTE — ED Notes (Signed)
Pt informed to return if any life threatening symptoms occur.

## 2016-05-09 NOTE — ED Notes (Signed)
Slipped and fell in thrift store yesterday and hurt right knee.

## 2016-09-25 ENCOUNTER — Encounter: Payer: Self-pay | Admitting: Emergency Medicine

## 2016-09-25 ENCOUNTER — Emergency Department
Admission: EM | Admit: 2016-09-25 | Discharge: 2016-09-25 | Disposition: A | Discharge status: Home / Self Care | Payer: No Typology Code available for payment source | Attending: Emergency Medicine | Admitting: Emergency Medicine

## 2016-09-25 DIAGNOSIS — Y9389 Activity, other specified: Secondary | ICD-10-CM | POA: Diagnosis not present

## 2016-09-25 DIAGNOSIS — Z79899 Other long term (current) drug therapy: Secondary | ICD-10-CM | POA: Insufficient documentation

## 2016-09-25 DIAGNOSIS — S161XXA Strain of muscle, fascia and tendon at neck level, initial encounter: Secondary | ICD-10-CM | POA: Diagnosis not present

## 2016-09-25 DIAGNOSIS — Z7982 Long term (current) use of aspirin: Secondary | ICD-10-CM | POA: Diagnosis not present

## 2016-09-25 DIAGNOSIS — S199XXA Unspecified injury of neck, initial encounter: Secondary | ICD-10-CM | POA: Diagnosis present

## 2016-09-25 DIAGNOSIS — E119 Type 2 diabetes mellitus without complications: Secondary | ICD-10-CM | POA: Insufficient documentation

## 2016-09-25 DIAGNOSIS — Y999 Unspecified external cause status: Secondary | ICD-10-CM | POA: Diagnosis not present

## 2016-09-25 DIAGNOSIS — Y92410 Unspecified street and highway as the place of occurrence of the external cause: Secondary | ICD-10-CM | POA: Diagnosis not present

## 2016-09-25 DIAGNOSIS — I10 Essential (primary) hypertension: Secondary | ICD-10-CM | POA: Diagnosis not present

## 2016-09-25 MED ORDER — IBUPROFEN 50 MG PO CHEW: 50.0000 mg | CHEWABLE_TABLET | Freq: Three times a day (TID) | ORAL | 2 refills | Status: AC | PRN

## 2016-09-25 MED ORDER — CYCLOBENZAPRINE HCL 10 MG PO TABS: 10.0000 mg | ORAL_TABLET | Freq: Three times a day (TID) | ORAL | 0 refills | Status: DC | PRN

## 2016-09-25 MED ORDER — NAPROXEN 500 MG PO TABS: 500.0000 mg | ORAL_TABLET | Freq: Two times a day (BID) | ORAL | 0 refills | Status: DC
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
## 2016-12-28 ENCOUNTER — Telehealth: Payer: Self-pay | Admitting: Family Medicine

## 2016-12-28 DIAGNOSIS — F32A Depression, unspecified: Secondary | ICD-10-CM

## 2016-12-28 DIAGNOSIS — F329 Major depressive disorder, single episode, unspecified: Secondary | ICD-10-CM

## 2016-12-28 DIAGNOSIS — F419 Anxiety disorder, unspecified: Principal | ICD-10-CM

## 2016-12-28 NOTE — Telephone Encounter (Signed)
[facility]

## 2016-12-31 ENCOUNTER — Telehealth: Payer: Self-pay | Admitting: Family Medicine

## 2016-12-31 DIAGNOSIS — I10 Essential (primary) hypertension: Secondary | ICD-10-CM

## 2017-01-02 NOTE — Telephone Encounter (Signed)
She must be seen, last visit one year ago. I will send 30 days

## 2017-01-02 NOTE — Telephone Encounter (Signed)
Patient requesting [facility] of [facility]

## 2017-01-02 NOTE — Telephone Encounter (Signed)
lvm informing pt prescription has been sent to pharmacy and that she need to schedule appt

## 2017-01-04 NOTE — Telephone Encounter (Signed)
Left voice message informing pt

## 2018-01-23 ENCOUNTER — Other Ambulatory Visit: Payer: Self-pay | Admitting: Family Medicine

## 2018-12-22 HISTORY — PX: ORIF TIBIA FRACTURE: SHX5416

## 2018-12-26 ENCOUNTER — Encounter: Payer: Self-pay | Admitting: Emergency Medicine

## 2018-12-26 ENCOUNTER — Other Ambulatory Visit: Payer: Self-pay

## 2018-12-26 ENCOUNTER — Emergency Department: Payer: No Typology Code available for payment source

## 2018-12-26 ENCOUNTER — Emergency Department
Admission: EM | Admit: 2018-12-26 | Discharge: 2018-12-27 | Disposition: A | Discharge status: Other Acute Inpatient Hospital | Payer: No Typology Code available for payment source | Attending: Emergency Medicine | Admitting: Emergency Medicine

## 2018-12-26 DIAGNOSIS — I10 Essential (primary) hypertension: Secondary | ICD-10-CM | POA: Diagnosis not present

## 2018-12-26 DIAGNOSIS — S82391A Other fracture of lower end of right tibia, initial encounter for closed fracture: Secondary | ICD-10-CM | POA: Diagnosis not present

## 2018-12-26 DIAGNOSIS — Y929 Unspecified place or not applicable: Secondary | ICD-10-CM | POA: Diagnosis not present

## 2018-12-26 DIAGNOSIS — E119 Type 2 diabetes mellitus without complications: Secondary | ICD-10-CM | POA: Diagnosis not present

## 2018-12-26 DIAGNOSIS — Z79899 Other long term (current) drug therapy: Secondary | ICD-10-CM | POA: Diagnosis not present

## 2018-12-26 DIAGNOSIS — Y939 Activity, unspecified: Secondary | ICD-10-CM | POA: Diagnosis not present

## 2018-12-26 DIAGNOSIS — Z7982 Long term (current) use of aspirin: Secondary | ICD-10-CM | POA: Insufficient documentation

## 2018-12-26 DIAGNOSIS — S82831A Other fracture of upper and lower end of right fibula, initial encounter for closed fracture: Secondary | ICD-10-CM | POA: Insufficient documentation

## 2018-12-26 DIAGNOSIS — S82201A Unspecified fracture of shaft of right tibia, initial encounter for closed fracture: Secondary | ICD-10-CM

## 2018-12-26 DIAGNOSIS — S8991XA Unspecified injury of right lower leg, initial encounter: Secondary | ICD-10-CM | POA: Diagnosis present

## 2018-12-26 DIAGNOSIS — Y999 Unspecified external cause status: Secondary | ICD-10-CM | POA: Insufficient documentation

## 2018-12-26 DIAGNOSIS — S82401A Unspecified fracture of shaft of right fibula, initial encounter for closed fracture: Secondary | ICD-10-CM

## 2018-12-26 LAB — CBC WITH DIFFERENTIAL/PLATELET
Abs Immature Granulocytes: 0.07 10*3/uL (ref 0.00–0.07)
Basophils Absolute: 0 10*3/uL (ref 0.0–0.1)
Basophils Relative: 0 %
Eosinophils Absolute: 0 10*3/uL (ref 0.0–0.5)
Eosinophils Relative: 0 %
HCT: 41.2 % (ref 36.0–46.0)
Hemoglobin: 13 g/dL (ref 12.0–15.0)
Immature Granulocytes: 1 %
Lymphocytes Relative: 19 %
Lymphs Abs: 1.9 10*3/uL (ref 0.7–4.0)
MCH: 26.2 pg (ref 26.0–34.0)
MCHC: 31.6 g/dL (ref 30.0–36.0)
MCV: 82.9 fL (ref 80.0–100.0)
Monocytes Absolute: 0.6 10*3/uL (ref 0.1–1.0)
Monocytes Relative: 6 %
Neutro Abs: 7.4 10*3/uL (ref 1.7–7.7)
Neutrophils Relative %: 74 %
Platelets: 359 10*3/uL (ref 150–400)
RBC: 4.97 MIL/uL (ref 3.87–5.11)
RDW: 14.3 % (ref 11.5–15.5)
WBC: 10 10*3/uL (ref 4.0–10.5)
nRBC: 0 % (ref 0.0–0.2)

## 2018-12-26 LAB — BASIC METABOLIC PANEL
Anion gap: 8 (ref 5–15)
BUN: 15 mg/dL (ref 6–20)
CO2: 23 mmol/L (ref 22–32)
Calcium: 8.8 mg/dL — ABNORMAL LOW (ref 8.9–10.3)
Chloride: 101 mmol/L (ref 98–111)
Creatinine, Ser: 0.9 mg/dL (ref 0.44–1.00)
GFR calc Af Amer: >60 mL/min (ref >60)
GFR calc non Af Amer: >60 mL/min (ref >60)
Glucose, Bld: 125 mg/dL — ABNORMAL HIGH (ref 70–99)
Potassium: 3.6 mmol/L (ref 3.5–5.1)
Sodium: 132 mmol/L — ABNORMAL LOW (ref 135–145)

## 2018-12-26 LAB — GLUCOSE, CAPILLARY: Glucose-Capillary: 99 mg/dL (ref 70–99)

## 2018-12-26 MED ORDER — HYDROMORPHONE HCL 1 MG/ML IJ SOLN
1.0000 mg | Freq: Once | INTRAMUSCULAR | Status: AC
  Administered 2018-12-26: 1 mg via INTRAVENOUS
  Filled 2018-12-26: qty 1

## 2018-12-26 MED ORDER — MORPHINE SULFATE (PF) 4 MG/ML IV SOLN
4.0000 mg | Freq: Once | INTRAVENOUS | Status: AC
  Administered 2018-12-26: 4 mg via INTRAVENOUS
  Filled 2018-12-26: qty 1

## 2018-12-26 MED ORDER — ORPHENADRINE CITRATE 30 MG/ML IJ SOLN
60.0000 mg | Freq: Two times a day (BID) | INTRAMUSCULAR | Status: DC
  Administered 2018-12-26: 60 mg via INTRAMUSCULAR
  Filled 2018-12-26: qty 2

## 2018-12-26 MED ORDER — SODIUM CHLORIDE 0.9 % IV SOLN
8.0000 mg | Freq: Once | INTRAVENOUS | Status: AC
  Administered 2018-12-26: 8 mg via INTRAVENOUS
  Filled 2018-12-26: qty 4

## 2018-12-26 MED ORDER — LORAZEPAM 2 MG/ML IJ SOLN
2.0000 mg | Freq: Once | INTRAMUSCULAR | Status: AC
  Administered 2018-12-26: 2 mg via INTRAVENOUS
  Filled 2018-12-26: qty 1
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
## 2018-12-27 NOTE — ED Notes (Signed)
EMTALA complete at time of transfer.

## 2018-12-27 NOTE — ED Notes (Signed)
Pt with [facility] to go to [facility].

## 2018-12-27 NOTE — ED Notes (Signed)
Pt received from ED Room 46 to room 08 from [place], [place]. Pt resting comfortably with NAD at this time and no requests. Will continue to monitor until transferred to [facility].

## 2019-04-01 ENCOUNTER — Emergency Department
Admission: EM | Admit: 2019-04-01 | Discharge: 2019-04-01 | Disposition: A | Discharge status: Home / Self Care | Payer: Medicaid Other | Attending: Emergency Medicine | Admitting: Emergency Medicine

## 2019-04-01 ENCOUNTER — Other Ambulatory Visit: Payer: Self-pay

## 2019-04-01 ENCOUNTER — Encounter: Payer: Self-pay | Admitting: *Deleted

## 2019-04-01 DIAGNOSIS — I10 Essential (primary) hypertension: Secondary | ICD-10-CM | POA: Diagnosis not present

## 2019-04-01 DIAGNOSIS — Z79899 Other long term (current) drug therapy: Secondary | ICD-10-CM | POA: Insufficient documentation

## 2019-04-01 DIAGNOSIS — M5441 Lumbago with sciatica, right side: Secondary | ICD-10-CM | POA: Diagnosis not present

## 2019-04-01 DIAGNOSIS — Z7982 Long term (current) use of aspirin: Secondary | ICD-10-CM | POA: Insufficient documentation

## 2019-04-01 DIAGNOSIS — M545 Low back pain: Secondary | ICD-10-CM | POA: Diagnosis present

## 2019-04-01 DIAGNOSIS — M5442 Lumbago with sciatica, left side: Secondary | ICD-10-CM | POA: Insufficient documentation

## 2019-04-01 DIAGNOSIS — E119 Type 2 diabetes mellitus without complications: Secondary | ICD-10-CM | POA: Insufficient documentation

## 2019-04-01 LAB — URINALYSIS, COMPLETE (UACMP) WITH MICROSCOPIC
Bacteria, UA: NONE SEEN
Bilirubin Urine: NEGATIVE
Glucose, UA: NEGATIVE mg/dL
Ketones, ur: NEGATIVE mg/dL
Nitrite: NEGATIVE
Protein, ur: 100 mg/dL — AB
RBC / HPF: >50 RBC/hpf — ABNORMAL HIGH (ref 0–5)
Specific Gravity, Urine: 1.033 — ABNORMAL HIGH (ref 1.005–1.030)
pH: 5 (ref 5.0–8.0)

## 2019-04-01 LAB — PREGNANCY, URINE: Preg Test, Ur: NEGATIVE

## 2019-04-01 LAB — POCT PREGNANCY, URINE: Preg Test, Ur: NEGATIVE

## 2019-04-01 MED ORDER — METHOCARBAMOL 500 MG PO TABS: 500.0000 mg | ORAL_TABLET | Freq: Three times a day (TID) | ORAL | 0 refills | Status: AC | PRN

## 2019-04-01 MED ORDER — DEXAMETHASONE SODIUM PHOSPHATE 10 MG/ML IJ SOLN
10.0000 mg | Freq: Once | INTRAMUSCULAR | Status: AC
  Administered 2019-04-01: 22:00:00 10 mg via INTRAMUSCULAR
  Filled 2019-04-01: qty 1

## 2019-04-01 MED ORDER — PREDNISONE 10 MG (21) PO TBPK: ORAL_TABLET | ORAL | 0 refills | Status: DC

## 2019-04-01 NOTE — ED Triage Notes (Signed)
Pt reports low back pain for 3 days.  Pt bent over to put on clothes and felt back pain.  Pt alert.

## 2019-04-01 NOTE — ED Notes (Signed)
Patient states she was getting dressed and moved wrong way Saturday morning. Patient states having lower back pain since. Patient states pain is lower and both legs have been going to sleep since this has happened.

## 2019-04-01 NOTE — ED Provider Notes (Signed)
[facility] Emergency Department Provider Note  ____________________________________________  Time seen: Approximately 9:54 PM  I have reviewed the triage vital signs and the nursing notes.   HISTORY  Chief Complaint Back Pain    HPI Kara Richmond is a 48 y.o. female presents to the emergency department with acute low back pain with bilateral lower extremity radiculopathy that has occurred over the past 2 to 3 days.  Patient reports that she was attempting to put on her undergarments when she felt low back pain.  Patient has been working at home and reports that she has been sitting more than usual.  She denies a history of chronic back pain.  No dysuria, hematuria or increased urinary frequency.  Patient denies fever.  No history of IV drug use.  Patient denies bowel or bladder incontinence or saddle anesthesia.  She has tried anti-inflammatories at home which have not relieved her symptoms.        Past Medical History:  Diagnosis Date  . Anxiety and depression   . AR (allergic rhinitis)   . Chronic osteoarthritis   . Clotting disorder ([place])    x2 lung; x1 left leg  . Controlled insomnia   . Diabetes mellitus without complication ([place])   . Dyslipidemia   . Heart murmur   . Hx of deep vein thrombophlebitis of lower extremity   . Hyperlipidemia   . Hypertension   . Lymphedema of leg   . Mild aortic regurgitation   . Morbid obesity ([place])   . Muscle spasm   . PMT (premenstrual tension)   . Vitamin D deficiency     Patient Active Problem List   Diagnosis Date Noted  . Diet-controlled type 2 diabetes mellitus ([place]) 09/02/2015  . Mild tricuspid regurgitation 09/02/2015  . Allergic rhinitis 06/08/2015  . Chronic osteoarthritis 06/08/2015  . Controlled insomnia 06/08/2015  . Anxiety and depression 06/08/2015  . Dyslipidemia 06/08/2015  . Eustachian tube disorder 06/08/2015  . History of HPV infection 06/08/2015  . Lymphedema 06/08/2015  .  Premenstrual tension syndrome 06/08/2015  . Vitamin D deficiency 06/08/2015  . Essential hypertension 09/19/2014  . Aortic valve disorder 01/05/2010  . 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 Extreme obesity 02/12/2008  . History of gastric bypass 09/17/2005  . H/O deep venous thrombosis 01/20/2004    Past Surgical History:  Procedure Laterality Date  . CHOLECYSTECTOMY    . GASTRIC BYPASS      Prior to Admission medications   Medication Sig Start Date End Date Taking? Authorizing Provider  ALPRAZolam Duanne Moron) 0.5 MG tablet Take 1 tablet (0.5 mg total) by mouth at bedtime as needed for anxiety. 02/11/16   Steele Sizer, MD  aspirin 81 MG tablet Take 81 mg by mouth daily.    [provider]  Bismuth Subsalicylate (PEPTO-BISMOL PO) Take by mouth as needed. Reported on 02/11/2016    [provider]  Calcium-Vitamin D-Vitamin K ([facility] CALCIUM SOFT CHEWS) [phone] MG-UNT-MCG CHEW Chew 2 tablets by mouth 2 (two) times daily.    [provider]  Cholecalciferol (VITAMIN D) 2000 UNITS tablet Take 2,000 Units by mouth daily.    [provider]  citalopram (CELEXA) 40 MG tablet TAKE 1 TABLET BY MOUTH EVERY DAY 12/28/16   Ancil Boozer, Drue Stager, MD  EPINEPHrine (EPIPEN 2-PAK) 0.3 mg/0.3 mL IJ SOAJ injection Inject 0.3 mg into the muscle once. Reported on 02/11/2016    [provider]  levonorgestrel (MIRENA) 20 MCG/24HR IUD 1 each by Intrauterine route once.  [provider]  lisinopril-hydrochlorothiazide (PRINZIDE,ZESTORETIC) 10-12.5 MG tablet TAKE 1 TABLET BY MOUTH DAILY 01/02/17   Steele Sizer, MD  MAGNESIUM GLUCONATE PO Take 500 mg by mouth 2 (two) times daily.    [provider]  methocarbamol (ROBAXIN) 500 MG tablet Take 1 tablet (500 mg total) by mouth every 8 (eight) hours as needed for up to 5 days. 04/01/19 04/06/19  Lannie Fields, PA-C  Multiple Vitamin (MULTIVITAMIN) tablet Take 1 tablet by mouth daily.    [provider]  naproxen (EC NAPROSYN) 500 MG EC tablet Take 1 tablet (500 mg total) by mouth 2 (two) times daily with a meal. 05/09/16   Menshew, Dannielle Karvonen, PA-C  naproxen (NAPROSYN) 500 MG tablet Take 1 tablet (500 mg total) by mouth 2 (two) times daily with a meal. 09/25/16   Sable Feil, PA-C  predniSONE (STERAPRED UNI-PAK 21 TAB) 10 MG (21) TBPK tablet Take 6 tabs the the 1st day. Take 6 tabs the the 2nd day. Take 5 tabs the the 3rd day. Take 5 tabs the 4th day. Take 4 tabs the the 5th day.Take 4 tabs the the 6th day.Take 3 tabs the 7th day.Take 3 tabs the 8th day. Take 2 tabs the 9th day. Take 2 tabs the 10th day. Take 1 tab the 11th day. Take 1 tab the 12th day. 04/01/19   Lannie Fields, PA-C  Zinc Gluconate 100 MG TABS Take by mouth daily.    [provider]    Allergies Patient has no known allergies.  Family History  Problem Relation Age of Onset  . Hypertension Mother   . Diabetes Mother   . CVA Mother   . Hypertension Father   . Hypertension Sister   . Diabetes Sister     Social History Social History   Tobacco Use  . Smoking status: Never Smoker  . Smokeless tobacco: Never Used  Substance Use Topics  . Alcohol use: No    Alcohol/week: 0.0 standard drinks  . Drug use: No     Review of Systems  Constitutional: No fever/chills Eyes: No visual changes. No discharge ENT: No upper respiratory complaints. Cardiovascular: no chest pain. Respiratory: no cough. No SOB. Gastrointestinal: No abdominal pain.  No nausea, no vomiting.  No diarrhea.  No constipation. Genitourinary: Negative for dysuria. No hematuria Musculoskeletal: Patient has low back pain.  Skin: Negative for rash, abrasions, lacerations, ecchymosis. Neurological: Negative for headaches, focal weakness or numbness.   ____________________________________________   PHYSICAL EXAM:  VITAL SIGNS: ED Triage Vitals  Enc Vitals Group     BP 04/01/19 2012 128/90     Pulse Rate 04/01/19 2012 90      Resp 04/01/19 2012 20     Temp 04/01/19 2012 98.5 F (36.9 C)     Temp Source 04/01/19 2012 Oral     SpO2 04/01/19 2012 97 %     Weight 04/01/19 2005 (!) 325 lb (147.4 kg)     Height 04/01/19 2005 5' 4"  (1.626 m)     Head Circumference --      Peak Flow --      Pain Score 04/01/19 2005 8     Pain Loc --      Pain Edu? --      Excl. in [place]? --      Constitutional: Alert and oriented. Well appearing and in no acute distress. Eyes: Conjunctivae are normal. PERRL. EOMI. Head: Atraumatic. Cardiovascular: Normal rate, regular rhythm. Normal S1 and S2.  Good peripheral circulation. Respiratory: Normal respiratory effort without tachypnea or retractions. Lungs CTAB. Good air entry to the bases with no decreased or absent breath sounds. Gastrointestinal: Bowel sounds 4 quadrants. Soft and nontender to palpation. No guarding or rigidity. No palpable masses. No distention. No CVA tenderness. Musculoskeletal: Patient has 5 out of 5 strength in the lower extremities bilaterally and symmetrically.  Full range of motion to all extremities. No gross deformities appreciated.  Patient has paraspinal muscle tenderness along the lumbar spine. Neurologic:  Normal speech and language. No gross focal neurologic deficits are appreciated.  Skin:  Skin is warm, dry and intact. No rash noted. Psychiatric: Mood and affect are normal. Speech and behavior are normal. Patient exhibits appropriate insight and judgement.   ____________________________________________   LABS (all labs ordered are listed, but only abnormal results are displayed)  Labs Reviewed  URINALYSIS, COMPLETE (UACMP) WITH MICROSCOPIC - Abnormal; Notable for the following components:      Result Value   Color, Urine YELLOW (*)    APPearance TURBID (*)    Specific Gravity, Urine 1.033 (*)    Hgb urine dipstick MODERATE (*)    Protein, ur 100 (*)    Leukocytes,Ua TRACE (*)    RBC / HPF >50 (*)    Non Squamous Epithelial PRESENT (*)     All other components within normal limits  PREGNANCY, URINE  POCT PREGNANCY, URINE   ____________________________________________  EKG   ____________________________________________  RADIOLOGY   No results found.  ____________________________________________    PROCEDURES  Procedure(s) performed:    Procedures    Medications  dexamethasone (DECADRON) injection 10 mg (10 mg Intramuscular Given 04/01/19 2158)     ____________________________________________   INITIAL IMPRESSION / ASSESSMENT AND PLAN / ED COURSE  Pertinent labs & imaging results that were available during my care of the patient were reviewed by me and considered in my medical decision making (see chart for details).  Review of the  CSRS was performed in accordance of the [place] prior to dispensing any controlled drugs.           Assessment and plan Low back pain Patient presents to the emergency department with acute low back pain that has persisted over the past 2 to 3 days.  On physical exam, patient had some paraspinal muscle tenderness along the lumbar spine but no CVA tenderness or suprapubic pain.  Neuro exam was otherwise reassuring.  Differential diagnosis included lumbar strain, sciatica, cystitis and pregnancy.  Urinalysis was noncontributory for cystitis and pregnancy testing was negative in the emergency department.  History and physical exam findings suggest sciatica.  Patient was discharged with tapered prednisone and Robaxin.  She was advised to follow-up with primary care as needed.  All patient questions were answered.     ____________________________________________  FINAL CLINICAL IMPRESSION(S) / ED DIAGNOSES  Final diagnoses:  Acute bilateral low back pain with bilateral sciatica      NEW MEDICATIONS STARTED DURING THIS VISIT:  ED Discharge Orders         Ordered    predniSONE (STERAPRED UNI-PAK 21 TAB) 10 MG (21) TBPK tablet     04/01/19 2207    methocarbamol  (ROBAXIN) 500 MG tablet  Every 8 hours PRN     04/01/19 2207              This chart was dictated using voice recognition software/Dragon. Despite best efforts to proofread, errors can occur which can change the meaning. Any change was purely unintentional.  Vallarie Mare [place], Hershal Coria 04/01/19 2219    Arta Silence, MD 04/01/19 2239

## 2019-04-01 NOTE — ED Notes (Signed)
Collected urine sample, urine was contaminated. Pt was told she would have to try again. Urine was used for POC.

## 2019-04-01 NOTE — ED Notes (Signed)
Patient in restroom to try to give clean specimen.

## 2019-04-11 ENCOUNTER — Telehealth: Payer: Self-pay | Admitting: Obstetrics & Gynecology

## 2019-04-11 NOTE — Telephone Encounter (Signed)
[facility] referring for Mirena removal and reinsertion. Paper records.

## 2019-04-11 NOTE — Telephone Encounter (Signed)
Spoke with patient about scheduling referral. Patient states she no longer needs refill. Advised patient to call back if she does need referral

## 2019-04-17 ENCOUNTER — Encounter: Payer: Self-pay | Admitting: Obstetrics and Gynecology

## 2019-04-17 ENCOUNTER — Ambulatory Visit (INDEPENDENT_AMBULATORY_CARE_PROVIDER_SITE_OTHER): Payer: Medicaid Other | Admitting: Obstetrics and Gynecology

## 2019-04-17 ENCOUNTER — Other Ambulatory Visit: Payer: Self-pay

## 2019-04-17 ENCOUNTER — Other Ambulatory Visit (HOSPITAL_COMMUNITY)
Admission: RE | Admit: 2019-04-17 | Discharge: 2019-04-17 | Disposition: A | Discharge status: Home / Self Care | Payer: Medicaid Other | Source: Ambulatory Visit | Attending: Obstetrics and Gynecology | Admitting: Obstetrics and Gynecology

## 2019-04-17 ENCOUNTER — Other Ambulatory Visit: Payer: Self-pay | Admitting: Medical Oncology

## 2019-04-17 VITALS — BP 137/94 | HR 106 | Ht 64.0 in | Wt 353.3 lb

## 2019-04-17 DIAGNOSIS — Z30433 Encounter for removal and reinsertion of intrauterine contraceptive device: Secondary | ICD-10-CM | POA: Insufficient documentation

## 2019-04-17 DIAGNOSIS — E668 Other obesity: Secondary | ICD-10-CM

## 2019-04-17 DIAGNOSIS — Z1231 Encounter for screening mammogram for malignant neoplasm of breast: Secondary | ICD-10-CM

## 2019-04-17 DIAGNOSIS — Z124 Encounter for screening for malignant neoplasm of cervix: Secondary | ICD-10-CM

## 2019-04-17 NOTE — Progress Notes (Signed)
     GYNECOLOGY OFFICE PROCEDURE NOTE  Kara Richmond is a 48 y.o. P1001 here for Mirena IUD removal and reinsertion. No GYN concerns.  Last pap smear was on 01/2016 and was normal.  IUD Removal and Reinsertion  Patient identified, informed consent performed, consent signed.   Discussed risks of irregular bleeding, cramping, infection, malpositioning or misplacement of the IUD outside the uterus which may require further procedures. Also advised to use backup contraception for one week as the risk of pregnancy is higher during the transition period of removing an IUD and replacing it with another one. Time out was performed. Speculum placed in the vagina. The strings of the IUD were grasped and pulled using ring forceps. The IUD was successfully removed in its entirety. The cervix was cleaned with Betadine x 2 and grasped anteriorly with a single tooth tenaculum.  A sound was then used to measure the uterus to 9 cm.  The cervix was then dilated using Pratt dilators due to spasm and constriction of the internal cervical os leading to difficulty inserting the IUD device. Once dilated, the new Mirena IUD was then placed per manufacturer's recommendations. Strings trimmed to 3 cm. Tenaculum was removed, good hemostasis noted. Patient tolerated procedure well.   Patient was given post-procedure instructions.  She was reminded to have backup contraception for one week during this transition period between IUDs.  Patient was also asked to check IUD strings periodically and follow up in 4 weeks for IUD check.  A pap smear was performed prior to removal of the initial IUD as she was due for her routine cervical cancer screening and does not have any upcoming appointments with her PCP in the near future.    Lot: [id number] Exp: May 2022  Rubie Maid, MD [facility]

## 2019-04-17 NOTE — Progress Notes (Signed)
Pt is present today for Mirena removal and insertion. Pt stated that she is doing well no problems.

## 2019-04-17 NOTE — Patient Instructions (Signed)
  Intrauterine Device Insertion, Care After  This sheet gives you information about how to care for yourself after your procedure. Your health care provider may also give you more specific instructions. If you have problems or questions, contact your health care provider. What can I expect after the procedure? After the procedure, it is common to have:  Cramps and pain in the abdomen.  Light bleeding (spotting) or heavier bleeding that is like your menstrual period. This may last for up to a few days.  Lower back pain.  Dizziness.  Headaches.  Nausea. Follow these instructions at home:  Before resuming sexual activity, check to make sure that you can feel the IUD string(s). You should be able to feel the end of the string(s) below the opening of your cervix. If your IUD string is in place, you may resume sexual activity. ? If you had a hormonal IUD inserted more than 7 days after your most recent period started, you will need to use a backup method of birth control for 7 days after IUD insertion. Ask your health care provider whether this applies to you.  Continue to check that the IUD is still in place by feeling for the string(s) after every menstrual period, or once a month.  Take over-the-counter and prescription medicines only as told by your health care provider.  Do not drive or use heavy machinery while taking prescription pain medicine.  Keep all follow-up visits as told by your health care provider. This is important. Contact a health care provider if:  You have bleeding that is heavier or lasts longer than a normal menstrual cycle.  You have a fever.  You have cramps or abdominal pain that get worse or do not get better with medicine.  You develop abdominal pain that is new or is not in the same area of earlier cramping and pain.  You feel lightheaded or weak.  You have abnormal or bad-smelling discharge from your vagina.  You have pain during sexual activity.   You have any of the following problems with your IUD string(s): ? The string bothers or hurts you or your sexual partner. ? You cannot feel the string. ? The string has gotten longer.  You can feel the IUD in your vagina.  You think you may be pregnant, or you miss your menstrual period.  You think you may have an STI (sexually transmitted infection). Get help right away if:  You have flu-like symptoms.  You have a fever and chills.  You can feel that your IUD has slipped out of place. Summary  After the procedure, it is common to have cramps and pain in the abdomen. It is also common to have light bleeding (spotting) or heavier bleeding that is like your menstrual period.  Continue to check that the IUD is still in place by feeling for the string(s) after every menstrual period, or once a month.  Keep all follow-up visits as told by your health care provider. This is important.  Contact your health care provider if you have problems with your IUD string(s), such as the string getting longer or bothering you or your sexual partner. This information is not intended to replace advice given to you by your health care provider. Make sure you discuss any questions you have with your health care provider. Document Released: 07/06/2011 Document Revised: 09/28/2016 Document Reviewed: 09/28/2016 Elsevier Interactive Patient Education  2019 [employer].

## 2019-04-17 NOTE — Addendum Note (Signed)
Addended by: Augusto Gamble on: 04/17/2019 12:41 PM   Modules accepted: Level of Service

## 2019-04-22 LAB — CYTOLOGY - PAP
Diagnosis: NEGATIVE
HPV: NOT DETECTED

## 2019-04-26 ENCOUNTER — Other Ambulatory Visit: Payer: Self-pay

## 2019-04-26 ENCOUNTER — Ambulatory Visit
Admission: RE | Admit: 2019-04-26 | Discharge: 2019-04-26 | Disposition: A | Discharge status: Home / Self Care | Payer: Medicaid Other | Source: Ambulatory Visit | Attending: Medical Oncology | Admitting: Medical Oncology

## 2019-04-26 DIAGNOSIS — Z1231 Encounter for screening mammogram for malignant neoplasm of breast: Secondary | ICD-10-CM | POA: Diagnosis not present

## 2019-04-29 ENCOUNTER — Other Ambulatory Visit: Payer: Self-pay | Admitting: Medical Oncology

## 2019-04-29 DIAGNOSIS — R928 Other abnormal and inconclusive findings on diagnostic imaging of breast: Secondary | ICD-10-CM

## 2019-04-29 DIAGNOSIS — N6489 Other specified disorders of breast: Secondary | ICD-10-CM

## 2019-05-08 ENCOUNTER — Other Ambulatory Visit: Payer: Self-pay

## 2019-05-08 ENCOUNTER — Ambulatory Visit
Admission: RE | Admit: 2019-05-08 | Discharge: 2019-05-08 | Disposition: A | Discharge status: Home / Self Care | Payer: 59 | Source: Ambulatory Visit | Attending: Medical Oncology | Admitting: Medical Oncology

## 2019-05-08 DIAGNOSIS — N6489 Other specified disorders of breast: Secondary | ICD-10-CM

## 2019-05-08 DIAGNOSIS — R928 Other abnormal and inconclusive findings on diagnostic imaging of breast: Secondary | ICD-10-CM | POA: Insufficient documentation

## 2019-05-14 ENCOUNTER — Telehealth: Payer: Self-pay

## 2019-05-14 NOTE — Telephone Encounter (Signed)
Pt called no answer LM via voicemail to call the office for prescreening.

## 2019-05-15 ENCOUNTER — Ambulatory Visit (INDEPENDENT_AMBULATORY_CARE_PROVIDER_SITE_OTHER): Payer: Medicaid Other | Admitting: Obstetrics and Gynecology

## 2019-05-15 ENCOUNTER — Encounter: Payer: Self-pay | Admitting: Obstetrics and Gynecology

## 2019-05-15 ENCOUNTER — Other Ambulatory Visit: Payer: Self-pay

## 2019-05-15 VITALS — BP 124/72 | HR 80 | Ht 64.0 in | Wt 358.0 lb

## 2019-05-15 DIAGNOSIS — Z30431 Encounter for routine checking of intrauterine contraceptive device: Secondary | ICD-10-CM

## 2019-05-15 NOTE — Progress Notes (Signed)
Pt is present today for mirena IUD one month check up. Pt stated that she was doing well and having no issues with the mirena. Pt stated having some spotting but nothing major.

## 2019-05-15 NOTE — Progress Notes (Signed)
     GYNECOLOGY OFFICE ENCOUNTER NOTE  History:  48 y.o. G1P1001 here today for today for IUD string check; Mirena  IUD was placed  04/17/2019. No complaints about the IUD, no concerning side effects.  The following portions of the patient's history were reviewed and updated as appropriate: allergies, current medications, past family history, past medical history, past social history, past surgical history and problem list. Last pap smear on 04/17/2019 was normal, negative HRHPV.  Review of Systems:  Pertinent items are noted in HPI.   Objective:  Physical Exam Blood pressure 124/72, pulse 80, height 5' 4"  (1.626 m), weight (!) 358 lb (162.4 kg). CONSTITUTIONAL: Well-developed, well-nourished female in no acute distress.  HENT:  Normocephalic, atraumatic. External right and left ear normal. Oropharynx is clear and moist ABDOMEN: Soft, no distention noted.   PELVIC: Normal appearing external genitalia; normal appearing vaginal mucosa and cervix.  IUD strings visualized, about 3 cm in length outside cervix.  EXTREMITIES: extremities normal, atraumatic, no cyanosis or edema NEUROLOGIC: Grossly normal   Assessment & Plan:  Patient to keep IUD in place for up to five years; can come in for removal if she desires pregnancy earlier or for any concerning side effects. Return to clinic for any scheduled appointments or for any gynecologic concerns as needed.    Rubie Maid, MD [facility]

## 2019-09-15 ENCOUNTER — Other Ambulatory Visit: Payer: Self-pay

## 2019-09-15 ENCOUNTER — Emergency Department
Admission: EM | Admit: 2019-09-15 | Discharge: 2019-09-15 | Disposition: A | Discharge status: Home / Self Care | Payer: 59 | Attending: Emergency Medicine | Admitting: Emergency Medicine

## 2019-09-15 ENCOUNTER — Encounter: Payer: Self-pay | Admitting: Emergency Medicine

## 2019-09-15 DIAGNOSIS — Z79899 Other long term (current) drug therapy: Secondary | ICD-10-CM | POA: Insufficient documentation

## 2019-09-15 DIAGNOSIS — Z7982 Long term (current) use of aspirin: Secondary | ICD-10-CM | POA: Insufficient documentation

## 2019-09-15 DIAGNOSIS — E119 Type 2 diabetes mellitus without complications: Secondary | ICD-10-CM | POA: Insufficient documentation

## 2019-09-15 DIAGNOSIS — N898 Other specified noninflammatory disorders of vagina: Secondary | ICD-10-CM | POA: Diagnosis present

## 2019-09-15 DIAGNOSIS — I10 Essential (primary) hypertension: Secondary | ICD-10-CM | POA: Diagnosis not present

## 2019-09-15 DIAGNOSIS — A599 Trichomoniasis, unspecified: Secondary | ICD-10-CM | POA: Insufficient documentation

## 2019-09-15 LAB — URINALYSIS, COMPLETE (UACMP) WITH MICROSCOPIC
Bilirubin Urine: NEGATIVE
Glucose, UA: NEGATIVE mg/dL
Hgb urine dipstick: NEGATIVE
Ketones, ur: NEGATIVE mg/dL
Nitrite: NEGATIVE
Protein, ur: NEGATIVE mg/dL
Specific Gravity, Urine: 1.017 (ref 1.005–1.030)
pH: 5 (ref 5.0–8.0)

## 2019-09-15 LAB — WET PREP, GENITAL
Clue Cells Wet Prep HPF POC: NONE SEEN
Sperm: NONE SEEN
Yeast Wet Prep HPF POC: NONE SEEN

## 2019-09-15 MED ORDER — METRONIDAZOLE 500 MG PO TABS: 500.0000 mg | ORAL_TABLET | Freq: Two times a day (BID) | ORAL | 0 refills | Status: DC

## 2019-09-15 NOTE — Discharge Instructions (Signed)
Follow-up with your primary care provider if any continued problems or concerns.  The Flagyl is for the infection.  Be sure to take the entire dose for 7 days.  Do not mix alcohol with this medication including cough medication as it will cause severe stomach cramps.

## 2019-09-15 NOTE — ED Triage Notes (Signed)
Pt presents to ED c/o vaginal irritation/itching starting last Wednesday. Denies urinary symptoms or vaginal discharge.

## 2019-09-15 NOTE — ED Notes (Signed)
Pt verbalized understanding of discharge instructions. NAD at this time. 

## 2019-09-15 NOTE — ED Provider Notes (Signed)
[facility] Emergency Department Provider Note  ____________________________________________   First MD Initiated Contact with Patient 09/15/19 1301     (approximate)  I have reviewed the triage vital signs and the nursing notes.   HISTORY  Chief Complaint No chief complaint on file.   HPI Kara Richmond is a 48 y.o. female presents to the ED with complaint of vaginal itching and irritation that started 4 days ago.  Patient states that she is sexually active but reports that they use condoms "every time".  She denies any vaginal discharge.  She denies any urinary symptoms.      Past Medical History:  Diagnosis Date  . Anxiety and depression   . AR (allergic rhinitis)   . Chronic osteoarthritis   . Clotting disorder ([place])    x2 lung; x1 left leg  . Controlled insomnia   . Diabetes mellitus without complication ([place])   . Dyslipidemia   . Heart murmur   . Hx of deep vein thrombophlebitis of lower extremity   . Hyperlipidemia   . Hypertension   . Lymphedema of leg   . Mild aortic regurgitation   . Morbid obesity ([place])   . Muscle spasm   . PMT (premenstrual tension)   . Vitamin D deficiency     Patient Active Problem List   Diagnosis Date Noted  . Diet-controlled type 2 diabetes mellitus ([place]) 09/02/2015  . Mild tricuspid regurgitation 09/02/2015  . Allergic rhinitis 06/08/2015  . Chronic osteoarthritis 06/08/2015  . Controlled insomnia 06/08/2015  . Anxiety and depression 06/08/2015  . Dyslipidemia 06/08/2015  . Eustachian tube disorder 06/08/2015  . History of HPV infection 06/08/2015  . Lymphedema 06/08/2015  . Premenstrual tension syndrome 06/08/2015  . Vitamin D deficiency 06/08/2015  . Essential hypertension 09/19/2014  . Aortic valve disorder 01/05/2010  . 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 Extreme obesity 02/12/2008  . History of gastric bypass 09/17/2005  . H/O deep venous thrombosis 01/20/2004    Past Surgical  History:  Procedure Laterality Date  . CHOLECYSTECTOMY    . GASTRIC BYPASS      Prior to Admission medications   Medication Sig Start Date End Date Taking? Authorizing Provider  ALPRAZolam Duanne Moron) 0.5 MG tablet Take 1 tablet (0.5 mg total) by mouth at bedtime as needed for anxiety. 02/11/16   Steele Sizer, MD  aspirin 81 MG tablet Take 81 mg by mouth daily.    [provider]  Bismuth Subsalicylate (PEPTO-BISMOL PO) Take by mouth as needed. Reported on 02/11/2016    [provider]  calcium carbonate (TUMS - DOSED IN MG ELEMENTAL CALCIUM) 500 MG chewable tablet Chew 1 tablet by mouth daily.    [provider]  Calcium-Vitamin D-Vitamin K ([facility] CALCIUM SOFT CHEWS) [phone] MG-UNT-MCG CHEW Chew 2 tablets by mouth 2 (two) times daily.    [provider]  Cholecalciferol (VITAMIN D) 2000 UNITS tablet Take 2,000 Units by mouth daily.    [provider]  citalopram (CELEXA) 40 MG tablet TAKE 1 TABLET BY MOUTH EVERY DAY 12/28/16   Ancil Boozer, Drue Stager, MD  EPINEPHrine (EPIPEN 2-PAK) 0.3 mg/0.3 mL IJ SOAJ injection Inject 0.3 mg into the muscle once. Reported on 02/11/2016    [provider]  levonorgestrel (MIRENA) 20 MCG/24HR IUD 1 each by Intrauterine route once.    [provider]  lisinopril-hydrochlorothiazide (PRINZIDE,ZESTORETIC) 10-12.5 MG tablet TAKE 1 TABLET BY MOUTH DAILY 01/02/17   Steele Sizer, MD  MAGNESIUM GLUCONATE PO Take 500 mg by  mouth 2 (two) times daily.    [provider]  metroNIDAZOLE (FLAGYL) 500 MG tablet Take 1 tablet (500 mg total) by mouth 2 (two) times daily. 09/15/19   Johnn Hai, PA-C  Multiple Vitamin (MULTIVITAMIN) tablet Take 1 tablet by mouth daily.    [provider]  naproxen (EC NAPROSYN) 500 MG EC tablet Take 1 tablet (500 mg total) by mouth 2 (two) times daily with a meal. 05/09/16   Menshew, Dannielle Karvonen, PA-C  naproxen (NAPROSYN) 500 MG tablet Take 1 tablet (500 mg total) by  mouth 2 (two) times daily with a meal. 09/25/16   Sable Feil, PA-C  Zinc Gluconate 100 MG TABS Take by mouth daily.    [provider]    Allergies Patient has no known allergies.  Family History  Problem Relation Age of Onset  . Hypertension Mother   . Diabetes Mother   . CVA Mother   . Hypertension Father   . Hypertension Sister   . Diabetes Sister   . Breast cancer Neg Hx     Social History Social History   Tobacco Use  . Smoking status: Never Smoker  . Smokeless tobacco: Never Used  Substance Use Topics  . Alcohol use: No    Alcohol/week: 0.0 standard drinks  . Drug use: No    Review of Systems Constitutional: No fever/chills Cardiovascular: Denies chest pain. Respiratory: Denies shortness of breath. Gastrointestinal: No abdominal pain.  No nausea, no vomiting.  Genitourinary: Negative for dysuria.  Positive for vaginal itching and irritation.  Negative for vaginal discharge. Musculoskeletal: Negative for back pain. Skin: Negative for rash. Neurological: Negative for headaches, focal weakness or numbness. ___________________________________________   PHYSICAL EXAM:  VITAL SIGNS: ED Triage Vitals  Enc Vitals Group     BP 09/15/19 1218 119/69     Pulse Rate 09/15/19 1218 86     Resp 09/15/19 1218 16     Temp 09/15/19 1218 99.2 F (37.3 C)     Temp src --      SpO2 09/15/19 1218 100 %     Weight 09/15/19 1218 (!) 325 lb (147.4 kg)     Height 09/15/19 1218 5' 4"  (1.626 m)     Head Circumference --      Peak Flow --      Pain Score 09/15/19 1225 0     Pain Loc --      Pain Edu? --      Excl. in [place]? --     Constitutional: Alert and oriented. Well appearing and in no acute distress.  Large body habitus. Eyes: Conjunctivae are normal.  Head: Atraumatic. Neck: No stridor.   Cardiovascular: Normal rate, regular rhythm. Grossly normal heart sounds.  Good peripheral circulation. Respiratory: Normal respiratory effort.  No retractions. Lungs  CTAB. Gastrointestinal: Soft and nontender. No distention.  Genitourinary: External genitalia is unremarkable.  There is a yellowish discharge noted in the vaginal vault.  No masses or cervical motion tenderness. Musculoskeletal: No lower extremity tenderness nor edema.  No joint effusions. Neurologic:  Normal speech and language. No gross focal neurologic deficits are appreciated. No gait instability. Skin:  Skin is warm, dry and intact. No rash noted. Psychiatric: Mood and affect are normal. Speech and behavior are normal.  ____________________________________________   LABS (all labs ordered are listed, but only abnormal results are displayed)  Labs Reviewed  WET PREP, GENITAL - Abnormal; Notable for the following components:      Result Value  Trich, Wet Prep PRESENT (*)    WBC, Wet Prep HPF POC MODERATE (*)    All other components within normal limits  URINALYSIS, COMPLETE (UACMP) WITH MICROSCOPIC - Abnormal; Notable for the following components:   Color, Urine YELLOW (*)    APPearance CLOUDY (*)    Leukocytes,Ua LARGE (*)    Bacteria, UA MANY (*)    All other components within normal limits  POC URINE PREG, ED    PROCEDURES  Procedure(s) performed (including Critical Care):  Procedures   ____________________________________________   INITIAL IMPRESSION / ASSESSMENT AND PLAN / ED COURSE  As part of my medical decision making, I reviewed the following data within the electronic MEDICAL RECORD NUMBER Notes from prior ED visits and [place] Controlled Substance Database  48 year old female presents to the ED with complaint of vaginal itching and irritation that started 4 days ago.  Patient denies any vaginal discharge.  She states she is sexually active and uses protection each and every time.  Boyfriend has no complaints.  Wet prep was positive for trichomonas and patient was made aware.  We discussed the Flagyl and also that she should not drink or take any medications containing  alcohol while on this medication.  She is to follow-up with her PCP if any continued problems.  ____________________________________________   FINAL CLINICAL IMPRESSION(S) / ED DIAGNOSES  Final diagnoses:  Trichomonas vaginalis infection     ED Discharge Orders         Ordered    metroNIDAZOLE (FLAGYL) 500 MG tablet  2 times daily     09/15/19 1455           Note:  This document was prepared using Dragon voice recognition software and may include unintentional dictation errors.    Johnn Hai, PA-C 09/15/19 1606    Vanessa Gloria [place], MD 09/16/19 1331

## 2019-09-26 ENCOUNTER — Encounter: Payer: Medicaid Other | Admitting: Obstetrics and Gynecology

## 2019-10-11 ENCOUNTER — Other Ambulatory Visit (HOSPITAL_COMMUNITY)
Admission: RE | Admit: 2019-10-11 | Discharge: 2019-10-11 | Disposition: A | Discharge status: Home / Self Care | Payer: 59 | Source: Ambulatory Visit | Attending: Obstetrics and Gynecology | Admitting: Obstetrics and Gynecology

## 2019-10-11 ENCOUNTER — Other Ambulatory Visit: Payer: Self-pay

## 2019-10-11 ENCOUNTER — Encounter: Payer: Self-pay | Admitting: Obstetrics and Gynecology

## 2019-10-11 ENCOUNTER — Ambulatory Visit (INDEPENDENT_AMBULATORY_CARE_PROVIDER_SITE_OTHER): Payer: 59 | Admitting: Obstetrics and Gynecology

## 2019-10-11 VITALS — BP 117/83 | HR 94 | Ht 64.0 in | Wt 342.8 lb

## 2019-10-11 DIAGNOSIS — Z8619 Personal history of other infectious and parasitic diseases: Secondary | ICD-10-CM | POA: Diagnosis not present

## 2019-10-11 DIAGNOSIS — Z30433 Encounter for removal and reinsertion of intrauterine contraceptive device: Secondary | ICD-10-CM | POA: Diagnosis not present

## 2019-10-11 DIAGNOSIS — Z6841 Body Mass Index (BMI) 40.0 and over, adult: Secondary | ICD-10-CM

## 2019-10-11 DIAGNOSIS — F419 Anxiety disorder, unspecified: Secondary | ICD-10-CM | POA: Diagnosis not present

## 2019-10-11 DIAGNOSIS — F32A Depression, unspecified: Secondary | ICD-10-CM

## 2019-10-11 DIAGNOSIS — Z975 Presence of (intrauterine) contraceptive device: Secondary | ICD-10-CM | POA: Insufficient documentation

## 2019-10-11 DIAGNOSIS — F329 Major depressive disorder, single episode, unspecified: Secondary | ICD-10-CM

## 2019-10-11 NOTE — Progress Notes (Signed)
Pt present for TOC after STD treatment. Pt stated that she is not having any std symptoms at this time.

## 2019-10-11 NOTE — Addendum Note (Signed)
Addended by: Edwyna Shell on: 10/11/2019 10:21 AM   Modules accepted: Orders

## 2019-10-11 NOTE — Progress Notes (Signed)
° ° °  GYNECOLOGY PROGRESS NOTE  Subjective:    Patient ID: Kara Richmond, female    DOB: [birth year], 48 y.o.   MRN: [id number]  HPI  Patient is a 48 y.o. G2P1001 female who presents for TOC.for trichomoniasis. Has several questions regarding this diagnosis. Reports compliance with medications. Reports partner was tested and was negative.   Of note, patient has concerns about her Mirena IUD. Notes she has mood symptoms (depression) each time that she gets one. Desires to discuss other options. Is considering having it removed and having permanent sterilization.   The following portions of the patient's history were reviewed and updated as appropriate: allergies, current medications, past family history, past medical history, past social history, past surgical history and problem list.  Review of Systems Pertinent items noted in HPI and remainder of comprehensive ROS otherwise negative.   Objective:   Blood pressure 117/83, pulse 94, height 5' 4"  (1.626 m), weight (!) 342 lb 12.8 oz (155.5 kg). Body mass index is 58.84 kg/m.  General appearance: alert and no distress Abdomen: soft, non-tender; bowel sounds normal; no masses,  no organomegaly Pelvic: external genitalia normal, rectovaginal septum normal.  Vagina with scant thin discharge.  Cervix normal appearing, no lesions and no motion tenderness. Bimanual exam deferred.  Assessment:   1. History of trichomoniasis   2. Anxiety and depression   3. IUD (intrauterine device) in place   4. Morbid obesity with BMI of 50.0-59.9, adult ([place])    Plan:   1. TOC done today.  Answered all questions regarding trichomonas infection.  2. Mood changes associated with IUD. Discussed alternative contraception options with patient.  She has a h/o blood clots, cannot use estrogen containing products. Discussed progesterone OCPs and Depo Provera (although may not be as effective due to weight), decreasing dose of IUD or switching to a non-hormonal  IUD, or permanent sterilization.  Patient will try non-hormonal IUD.  Changed today (See procedure note below). RTC in 4 weeks for IUD check.       GYNECOLOGY OFFICE PROCEDURE NOTE  Kara Richmond is a 48 y.o. G1P1001 here for Mirena IUD removal ParaGard insertion. No GYN concerns.  Last pap smear was on 04/17/2019 and was normal.  IUD Removal and Reinsertion  Patient identified, informed consent performed, consent signed.   Discussed risks of irregular bleeding, cramping, infection, malpositioning or misplacement of the IUD outside the uterus which may require further procedures. Also discussed >99% contraception efficacy, increased risk of ectopic pregnancy with failure of method.  Advised to use backup contraception for one week as the risk of pregnancy is higher during the transition period of removing an IUD and replacing it with another one. Time out was performed. Speculum placed in the vagina. The strings of the IUD were grasped and pulled using ring forceps. The IUD was successfully removed in its entirety. The cervix was cleaned with Betadine x 2 and grasped anteriorly with a single tooth tenaculum.  The ParagardIUD insertion apparatus was used to sound the uterus to 3 cm;  the IUD was then placed per manufacturer's recommendations. Strings trimmed to 3 cm. Tenaculum was removed, good hemostasis noted. Patient tolerated procedure well.   Patient was given post-procedure instructions.  She was reminded to have backup contraception for one week during this transition period between IUDs.  Patient was also asked to check IUD strings periodically and follow up in 4 weeks for IUD check.   Rubie Maid, MD [facility]

## 2019-10-14 LAB — CERVICOVAGINAL ANCILLARY ONLY
Comment: NEGATIVE
Trichomonas: NEGATIVE

## 2019-10-15 ENCOUNTER — Telehealth: Payer: Self-pay | Admitting: Obstetrics and Gynecology

## 2019-10-15 NOTE — Telephone Encounter (Signed)
The patient called and stated that she is wanting to speak with her nurse to check the status of her results. The patient is requesting a call back. Please advise.

## 2019-10-16 NOTE — Telephone Encounter (Signed)
LM for patient to return call.

## 2019-10-16 NOTE — Telephone Encounter (Signed)
Pt called in wanting to know results. Pt is requesting call back. Pt is wanting to know if they have even got the results back.  Please advise

## 2019-11-08 ENCOUNTER — Encounter: Payer: Medicaid Other | Admitting: Obstetrics and Gynecology

## 2019-11-08 NOTE — Telephone Encounter (Signed)
Pt is aware of test results.

## 2019-11-20 ENCOUNTER — Encounter: Payer: Self-pay | Admitting: Obstetrics and Gynecology

## 2019-11-20 ENCOUNTER — Other Ambulatory Visit: Payer: Self-pay

## 2019-11-20 ENCOUNTER — Ambulatory Visit (INDEPENDENT_AMBULATORY_CARE_PROVIDER_SITE_OTHER): Payer: 59 | Admitting: Obstetrics and Gynecology

## 2019-11-20 VITALS — BP 110/72 | HR 80 | Ht 64.0 in | Wt 347.9 lb

## 2019-11-20 DIAGNOSIS — Z30431 Encounter for routine checking of intrauterine contraceptive device: Secondary | ICD-10-CM | POA: Diagnosis not present

## 2019-11-20 NOTE — Progress Notes (Signed)
    GYNECOLOGY OFFICE ENCOUNTER NOTE History:  48 y.o. G1P1001 here today for today for IUD string check; Paragard  IUD was placed 10/11/2019 (changed from [place] IUD due to mood changes). No complaints about the IUD, no concerning side effects. Notes she is spotting today, may possibly be the onset of her cycle.   The following portions of the patient's history were reviewed and updated as appropriate: allergies, current medications, past family history, past medical history, past social history, past surgical history and problem list. Last pap smear on 04/17/2019 was normal, negative HRHPV.  Review of Systems:  Pertinent items are noted in HPI.   Objective:  Physical Exam Blood pressure 110/72, pulse 80, height 5' 4"  (1.626 m), weight (!) 347 lb 14.4 oz (157.8 kg). CONSTITUTIONAL: Well-developed, well-nourished female in no acute distress.  ABDOMEN: Soft, no distention noted.   PELVIC: Normal appearing external genitalia; normal appearing vaginal mucosa and cervix.  IUD strings visualized, about 1 cm in length outside cervix.  Extremities: extremities normal, atraumatic, no cyanosis or edema Neurologic: Grossly normal  Assessment & Plan:  Patient to keep IUD in place for up to tenyears; can come in for removal if she desires for any concerning side effects.  Return to clinic for any scheduled appointments or for any gynecologic concerns as needed.    Rubie Maid, MD [facility]

## 2019-11-20 NOTE — Progress Notes (Signed)
Pt present for IUD string check. Pt stated that she was doing well no issues with the IUD.

## 2019-12-12 IMAGING — MG DIGITAL SCREENING BILATERAL MAMMOGRAM WITH TOMO AND CAD
8 of 15 series · 8 of 40 positions shown · non-contrast
Comparison: Previous exam(s).

CLINICAL DATA: Screening.

EXAM:
DIGITAL SCREENING BILATERAL MAMMOGRAM WITH TOMO AND CAD

[L CC synth-2D]
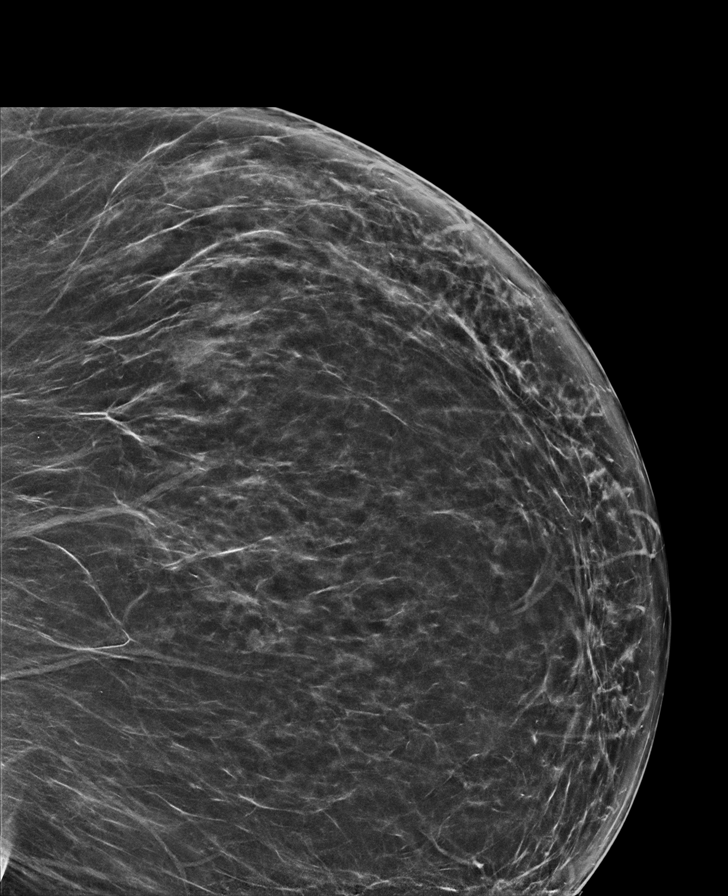

[R MLO synth-2D (1 of 2)]
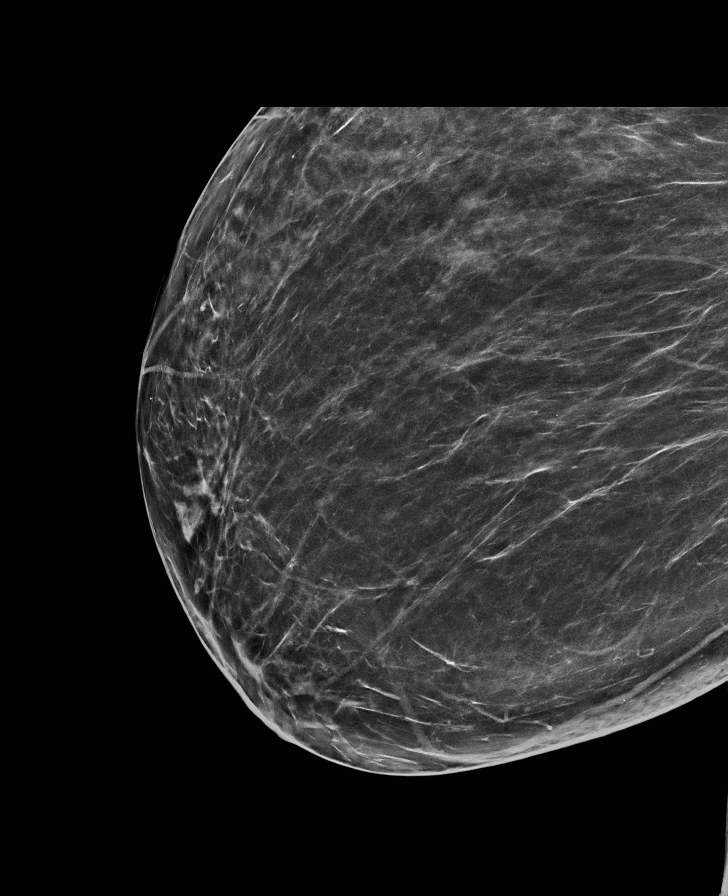

[R MLO synth-2D (2 of 2)]
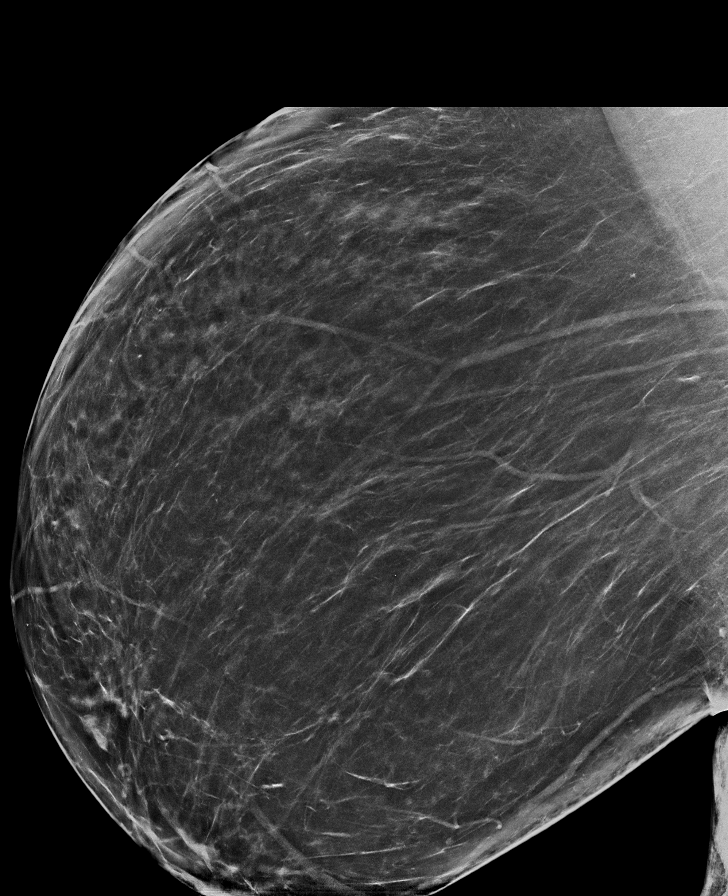

[R CC synth-2D (1 of 2)]
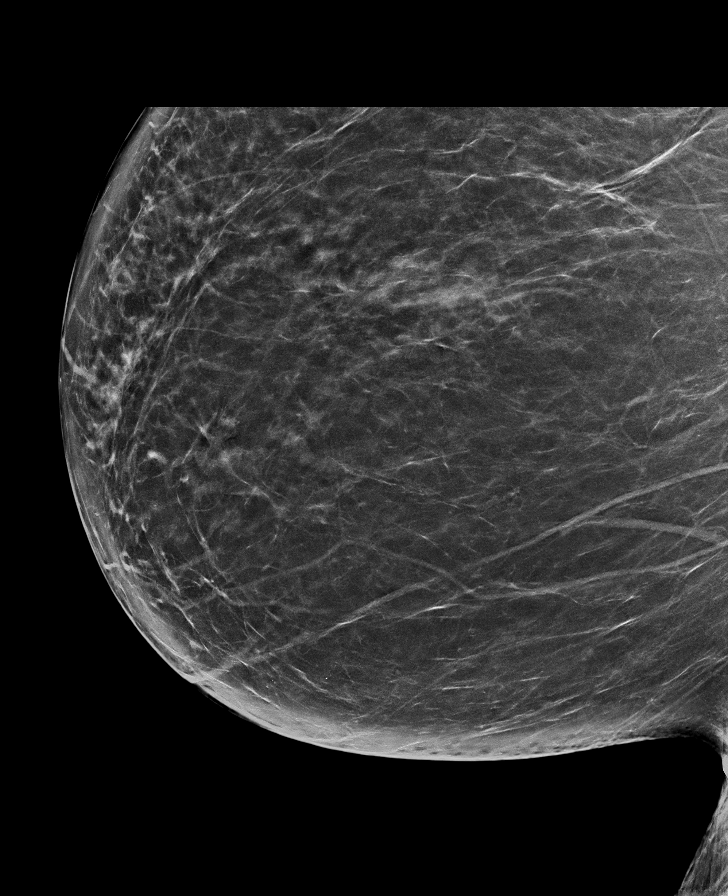

[L MLO synth-2D (1 of 2)]
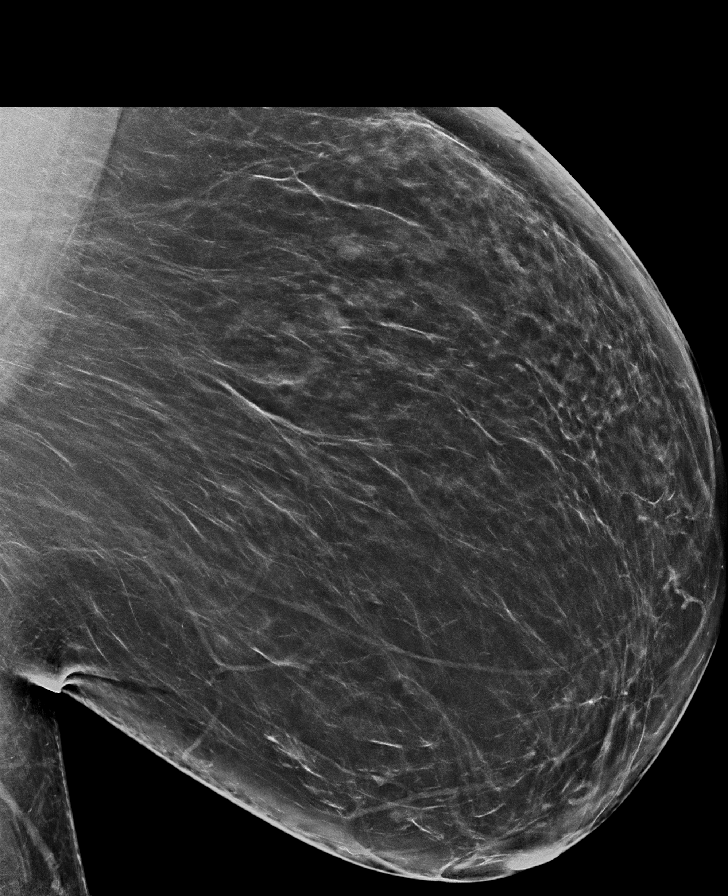

[R CC synth-2D (2 of 2)]
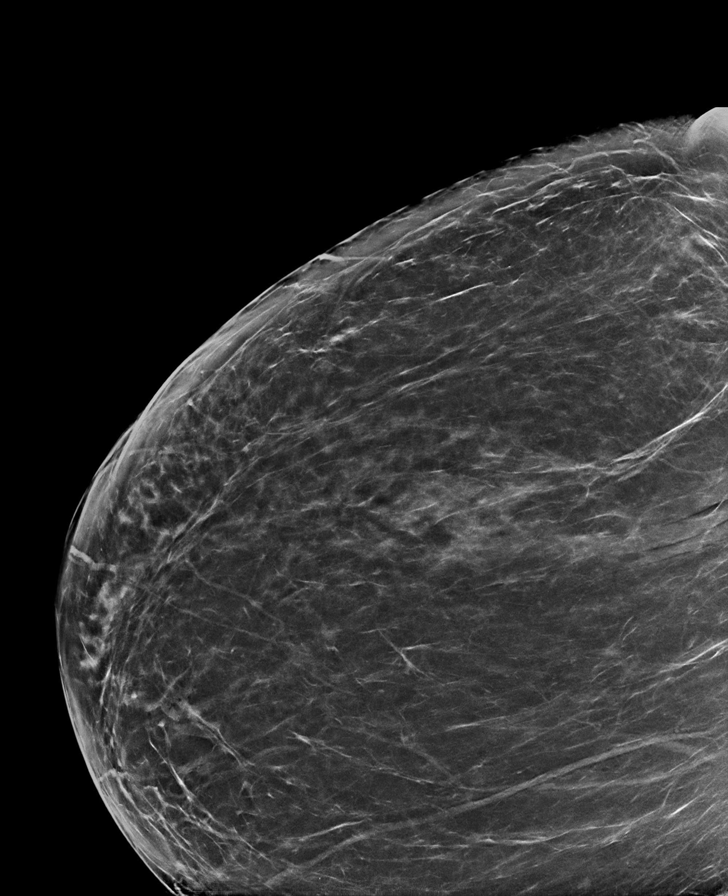

[L MLO synth-2D (2 of 2)]
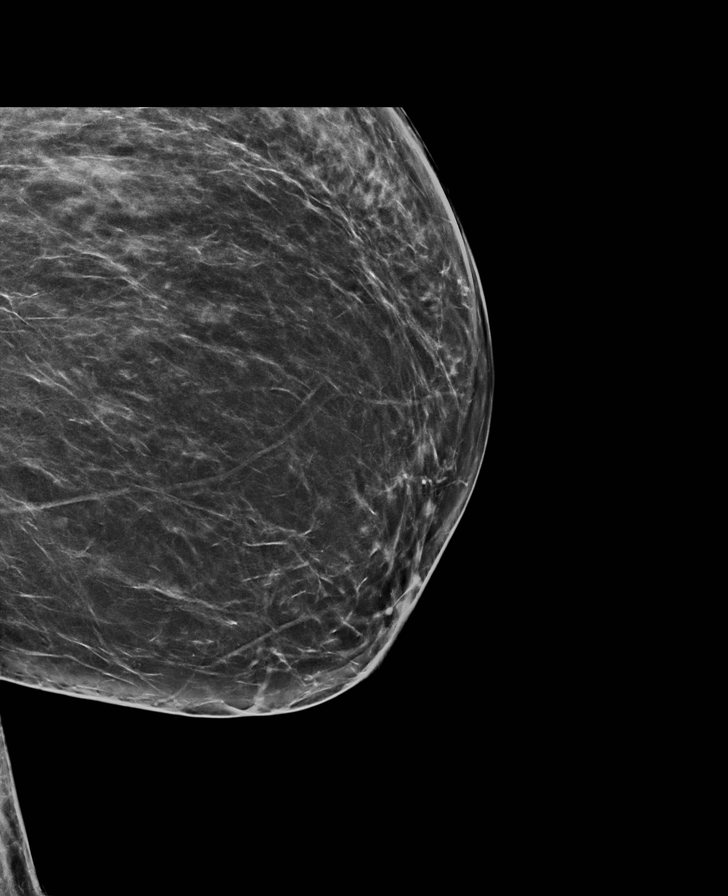

[R CC tomo · tomo slice 55/81.0]
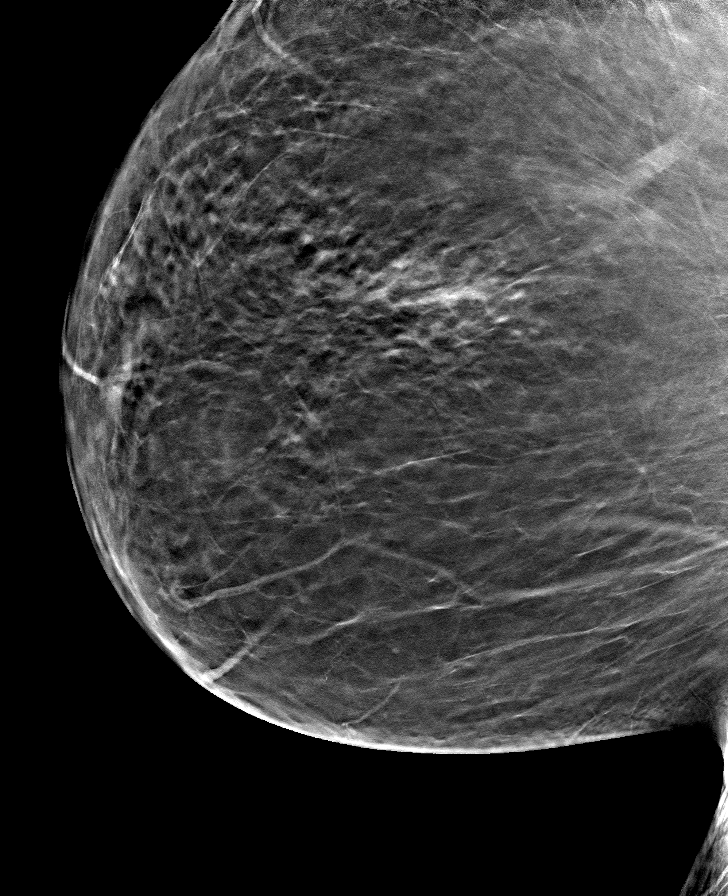

[8 of 40 positions shown; findings below may reference images not displayed]

ACR Breast Density Category c: The breast tissue is heterogeneously
dense, which may obscure small masses.
FINDINGS: In the left breast, a possible asymmetry warrants further
evaluation. In the right breast, no findings suspicious for
malignancy. Images were processed with CAD.
IMPRESSION: Further evaluation is suggested for possible asymmetry in the left
breast.

RECOMMENDATION:
Diagnostic mammogram and possibly ultrasound of the left breast.
(Code:[id number])

The patient will be contacted regarding the findings, and additional
imaging will be scheduled.

BI-RADS CATEGORY  0: Incomplete. Need additional imaging evaluation
and/or prior mammograms for comparison.

## 2020-01-21 ENCOUNTER — Other Ambulatory Visit: Payer: Self-pay

## 2020-01-21 ENCOUNTER — Ambulatory Visit (INDEPENDENT_AMBULATORY_CARE_PROVIDER_SITE_OTHER): Payer: 59 | Admitting: Obstetrics and Gynecology

## 2020-01-21 ENCOUNTER — Encounter: Payer: Self-pay | Admitting: Obstetrics and Gynecology

## 2020-01-21 VITALS — BP 106/73 | HR 76 | Ht 64.0 in | Wt 348.1 lb

## 2020-01-21 DIAGNOSIS — N921 Excessive and frequent menstruation with irregular cycle: Secondary | ICD-10-CM

## 2020-01-21 DIAGNOSIS — N930 Postcoital and contact bleeding: Secondary | ICD-10-CM

## 2020-01-21 DIAGNOSIS — Z975 Presence of (intrauterine) contraceptive device: Secondary | ICD-10-CM | POA: Diagnosis not present

## 2020-01-21 DIAGNOSIS — M545 Low back pain, unspecified: Secondary | ICD-10-CM

## 2020-01-21 LAB — POCT URINALYSIS DIPSTICK
Bilirubin, UA: NEGATIVE
Blood, UA: NEGATIVE
Glucose, UA: NEGATIVE
Nitrite, UA: NEGATIVE
Protein, UA: POSITIVE — AB
Spec Grav, UA: >=1.03 — AB (ref 1.010–1.025)
Urobilinogen, UA: 0.2 E.U./dL
pH, UA: 6 (ref 5.0–8.0)

## 2020-01-21 MED ORDER — ESTRADIOL 1 MG PO TABS: ORAL_TABLET | ORAL | 1 refills | Status: DC

## 2020-01-21 MED ORDER — MEDROXYPROGESTERONE ACETATE 10 MG PO TABS: 10.0000 mg | ORAL_TABLET | Freq: Every day | ORAL | 2 refills | Status: DC

## 2020-01-21 NOTE — Progress Notes (Signed)
    GYNECOLOGY PROGRESS NOTE  Subjective:    Patient ID: Kara Richmond, female    DOB: 08/08/1971, 49 y.o.   MRN: [id number]  HPI  Patient is a 49 y.o. G61P1001 female who presents for complaints of spotting between periods, postcoital bleeding, and low back pain.  She currently has a Paragard IUD in place, inserted November (changed from [place] IUD due to mood issues). Has had bleeding issues since insertion of the new IUD. Denies urinary symptoms or vaginal discharge.  The following portions of the patient's history were reviewed and updated as appropriate: allergies, current medications, past family history, past medical history, past social history, past surgical history and problem list.  Review of Systems Pertinent items noted in HPI and remainder of comprehensive ROS otherwise negative.   Objective:   Blood pressure 106/73, pulse 76, height 5' 4"  (1.626 m), weight (!) 348 lb 1.6 oz (157.9 kg), last menstrual period 01/19/2020. General appearance: alert and no distress Abdomen: soft, non-tender; bowel sounds normal; no masses,  no organomegaly Pelvic: external genitalia normal, small amount of blood at perineum. Rectovaginal septum normal.  Vagina with small amount blood noted in vaginal vault.  Cervix difficult to visualize due to blood and body habitus, but normal appearing, no lesions and no motion tenderness. IUD threads visible.  Uterus mobile, nontender, normal shape and size.  Adnexae non-palpable, nontender bilaterally.    Assessment:   1. Breakthrough bleeding with IUD   2. Low back pain, unspecified back pain laterality, unspecified chronicity, unspecified whether sciatica present   3. Postcoital bleeding    Plan:   1. Bleeding likely secondary to copper IUD and lack of hormonal suppression (previously on Mirena IUD for several years).  IUD appears to be in place on exam. Discussed option of hormonal add back.  Will try Provera 10 mg to assess if bleeding resolves.  If  not, may need to consider longer term therapy (HRT) vs non-hormonal Lysteda for cycles. May also need to consider ultrasound to evaluate internal location of IUD. 2. UA done today, normal except blood (however source is likely vaginal) and moderate leukocytes.  3. Postcoital bleeding, no evidence of cervical lesions. Same partner.  Does have h/o trichomoniasis diagnosed in October 2020. If bleeding does not resolve with Provera, may need to re-screen.     Rubie Maid, MD [facility] 01/21/2020 5:22 PM

## 2020-01-21 NOTE — Progress Notes (Signed)
Pt present for spotting between periods. Pt also stated having lower back pain. ua completed and documented.

## 2020-01-29 ENCOUNTER — Encounter: Payer: Medicaid Other | Admitting: Obstetrics and Gynecology

## 2020-05-05 ENCOUNTER — Other Ambulatory Visit: Payer: Self-pay | Admitting: Medical Oncology

## 2020-05-05 DIAGNOSIS — Z1231 Encounter for screening mammogram for malignant neoplasm of breast: Secondary | ICD-10-CM

## 2020-05-14 ENCOUNTER — Encounter: Payer: Medicaid Other | Admitting: Obstetrics and Gynecology

## 2020-05-15 ENCOUNTER — Encounter: Payer: Medicaid Other | Admitting: Obstetrics and Gynecology

## 2020-05-20 ENCOUNTER — Encounter: Payer: Medicaid Other | Admitting: Obstetrics and Gynecology

## 2020-06-02 ENCOUNTER — Ambulatory Visit
Admission: RE | Admit: 2020-06-02 | Discharge: 2020-06-02 | Disposition: A | Discharge status: Home / Self Care | Payer: 59 | Source: Ambulatory Visit | Attending: Medical Oncology | Admitting: Medical Oncology

## 2020-06-02 DIAGNOSIS — Z1231 Encounter for screening mammogram for malignant neoplasm of breast: Secondary | ICD-10-CM | POA: Diagnosis not present

## 2020-07-21 ENCOUNTER — Encounter: Payer: Self-pay | Admitting: Obstetrics and Gynecology

## 2020-07-21 ENCOUNTER — Other Ambulatory Visit (INDEPENDENT_AMBULATORY_CARE_PROVIDER_SITE_OTHER): Payer: 59

## 2020-07-21 ENCOUNTER — Other Ambulatory Visit: Payer: Self-pay

## 2020-07-21 ENCOUNTER — Ambulatory Visit (INDEPENDENT_AMBULATORY_CARE_PROVIDER_SITE_OTHER): Payer: 59 | Admitting: Obstetrics and Gynecology

## 2020-07-21 ENCOUNTER — Other Ambulatory Visit (HOSPITAL_COMMUNITY)
Admission: RE | Admit: 2020-07-21 | Discharge: 2020-07-21 | Disposition: A | Discharge status: Home / Self Care | Payer: 59 | Source: Ambulatory Visit | Attending: Obstetrics and Gynecology | Admitting: Obstetrics and Gynecology

## 2020-07-21 VITALS — BP 128/84 | HR 76 | Ht 64.0 in | Wt 366.1 lb

## 2020-07-21 DIAGNOSIS — Z975 Presence of (intrauterine) contraceptive device: Secondary | ICD-10-CM

## 2020-07-21 DIAGNOSIS — N921 Excessive and frequent menstruation with irregular cycle: Secondary | ICD-10-CM

## 2020-07-21 DIAGNOSIS — N930 Postcoital and contact bleeding: Secondary | ICD-10-CM | POA: Insufficient documentation

## 2020-07-21 MED ORDER — NORETHINDRONE ACETATE 5 MG PO TABS: 5.0000 mg | ORAL_TABLET | Freq: Every day | ORAL | 2 refills | Status: DC

## 2020-07-21 NOTE — Progress Notes (Signed)
    GYNECOLOGY PROGRESS NOTE  Subjective:    Patient ID: Kara Richmond, female    DOB: 15-Sep-1971, 49 y.o.   MRN: [id number]  HPI  Patient is a 49 y.o. G66P1001 female who presents for complaints of bleeding after sex, and discomfort with sex.  Currently has a Paragard IUD in place. Notes that  This has been an ongoing issue since the insertion of her IUD in November (switched from [place] IUD).  In March, attempted hormonal supplementation with Provera to assess if bleeding resolved, however she states it did not really help. States that she never had any bleeding issues while she had the Mirena IUD (however had it removed due to mood changes).   The following portions of the patient's history were reviewed and updated as appropriate: allergies, current medications, past family history, past medical history, past social history, past surgical history and problem list.  Review of Systems Pertinent items noted in HPI and remainder of comprehensive ROS otherwise negative.   Objective:   Blood pressure 128/84, pulse 76, height 5' 4"  (1.626 m), weight (!) 366 lb 1.6 oz (166.1 kg). General appearance: alert and no distress Abdomen: soft, non-tender; bowel sounds normal; no masses,  no organomegaly Pelvic: external genitalia normal, rectovaginal septum normal.  Vagina with small amount of dark brown blood.   Cervix normal appearing, no lesions and no motion tenderness.  IUD threads visualized, ~ 3 cm in length. Uterus mobile, nontender, normal shape and size.  Adnexae non-palpable, nontender bilaterally.  Extremities: extremities normal, atraumatic, no cyanosis or edema Neurologic: Grossly normal   Assessment:   Breakthrough bleeding with IUD Postcoital bleeding  Plan:   - Patient with persistent breakthrough bleeding/postcoital bleeding with Paraguard IUD in place.  Discussed again possible causes of bleeding including lack of hormonal suppression (previously on Mirena IUD for several  years), infection, structural abnormalities (fibroids, polyps).  Will order pelvic ultrasound to assess for proper IUD location and structural abnormalities.  Will also perform Nusw[place] for vaginitis/cervicitis screen (as patient also with h/o trichomoniasis in October last year). Lastly, discussed option of hormonal suppression with Aygestin daily, instead of 10 day Provera challenge, vs replacing IUD with lower dose hormonal option due to mood symptoms with Mirena.  Patient would like to try Aygestin.  Given 3 month trial.  - To follow up in 1 month, to f/u sooner if symptoms worsen.    Rubie Maid, MD [facility]

## 2020-07-21 NOTE — Patient Instructions (Signed)
Abnormal Uterine Bleeding Abnormal uterine bleeding is unusual bleeding from the uterus. It includes:  Bleeding or spotting between periods.  Bleeding after sex.  Bleeding that is heavier than normal.  Periods that last longer than usual.  Bleeding after menopause. Abnormal uterine bleeding can affect women at various stages in life, including teenagers, women in their reproductive years, pregnant women, and women who have reached menopause. Common causes of abnormal uterine bleeding include:  Pregnancy.  Growths of tissue (polyps).  A noncancerous tumor in the uterus (fibroid).  Infection.  Cancer.  Hormonal imbalances. Any type of abnormal bleeding should be evaluated by a health care provider. Many cases are minor and simple to treat, while others are more serious. Treatment will depend on the cause of the bleeding. Follow these instructions at home:  Monitor your condition for any changes.  Do not use tampons, douche, or have sex if told by your health care provider.  Change your pads often.  Get regular exams that include pelvic exams and cervical cancer screening.  Keep all follow-up visits as told by your health care provider. This is important. Contact a health care provider if:  Your bleeding lasts for more than one week.  You feel dizzy at times.  You feel nauseous or you vomit. Get help right away if:  You pass out.  Your bleeding soaks through a pad every hour.  You have abdominal pain.  You have a fever.  You become sweaty or weak.  You pass large blood clots from your vagina. Summary  Abnormal uterine bleeding is unusual bleeding from the uterus.  Any type of abnormal bleeding should be evaluated by a health care provider. Many cases are minor and simple to treat, while others are more serious.  Treatment will depend on the cause of the bleeding. This information is not intended to replace advice given to you by your health care provider.  Make sure you discuss any questions you have with your health care provider. Document Revised: 02/14/2018 Document Reviewed: 12/09/2016 Elsevier Patient Education  [place].

## 2020-07-21 NOTE — Progress Notes (Signed)
Pt present due to having irregular bleeding with IUD. Pt stated that she has noticed bleeding after sex and pain with sex.

## 2020-07-23 LAB — CERVICOVAGINAL ANCILLARY ONLY
Bacterial Vaginitis (gardnerella): POSITIVE — AB
Candida Glabrata: NEGATIVE
Candida Vaginitis: NEGATIVE
Chlamydia: NEGATIVE
Comment: NEGATIVE
Comment: NEGATIVE
Comment: NEGATIVE
Comment: NEGATIVE
Comment: NEGATIVE
Comment: NORMAL
Neisseria Gonorrhea: NEGATIVE
Trichomonas: NEGATIVE

## 2020-07-23 MED ORDER — METRONIDAZOLE 500 MG PO TABS: 500.0000 mg | ORAL_TABLET | Freq: Two times a day (BID) | ORAL | 0 refills | Status: AC

## 2020-07-26 ENCOUNTER — Encounter: Payer: Self-pay | Admitting: Obstetrics and Gynecology

## 2020-08-12 NOTE — Progress Notes (Signed)
Pt present for follow up for AUB.  Pt stated that she has noticed some improvement in her AUB since her last visit.

## 2020-08-13 ENCOUNTER — Other Ambulatory Visit: Payer: Self-pay

## 2020-08-13 ENCOUNTER — Encounter: Payer: Self-pay | Admitting: Obstetrics and Gynecology

## 2020-08-13 ENCOUNTER — Ambulatory Visit (INDEPENDENT_AMBULATORY_CARE_PROVIDER_SITE_OTHER): Payer: 59 | Admitting: Obstetrics and Gynecology

## 2020-08-13 VITALS — BP 105/70 | HR 75 | Ht 64.0 in | Wt 361.3 lb

## 2020-08-13 DIAGNOSIS — N930 Postcoital and contact bleeding: Secondary | ICD-10-CM | POA: Diagnosis not present

## 2020-08-13 NOTE — Progress Notes (Signed)
    GYNECOLOGY PROGRESS NOTE  Subjective:    Patient ID: Kara Richmond, female    DOB: 11/19/1971, 49 y.o.   MRN: [id number]  HPI  Patient is a 49 y.o. G19P1001 female who presents for follow up of abnormal bleeding (mostly postcoital bleeding). Notes that bleeding has improved using the estrogen tablets and has discontinued the progesterone tablets.    The following portions of the patient's history were reviewed and updated as appropriate: allergies, current medications, past family history, past medical history, past social history, past surgical history and problem list.  Review of Systems Pertinent items noted in HPI and remainder of comprehensive ROS otherwise negative.   Objective:   Blood pressure 105/70, pulse 75, height 5' 4"  (1.626 m), weight (!) 361 lb 4.8 oz (163.9 kg), last menstrual period 08/03/2020. General appearance: alert and no distress Remainder of exam deferred.    Assessment:   Postcoital bleeding  Plan:   - Notes after she finishes hormones, will not do any other interventions for bleeding unless severe. Does not like the way hormones make her feel. Also discussed other options, including endometrial ablation.  Patient will think about it, but would likely not pursue unless symptoms become severe and bleeding becomes more irregular or heavy.    Rubie Maid, MD [facility]

## 2020-08-27 ENCOUNTER — Other Ambulatory Visit: Payer: Self-pay

## 2020-08-27 ENCOUNTER — Ambulatory Visit (INDEPENDENT_AMBULATORY_CARE_PROVIDER_SITE_OTHER): Payer: 59 | Admitting: Obstetrics and Gynecology

## 2020-08-27 ENCOUNTER — Encounter: Payer: Self-pay | Admitting: Obstetrics and Gynecology

## 2020-08-27 VITALS — BP 110/74 | HR 80 | Ht 64.0 in | Wt 361.5 lb

## 2020-08-27 DIAGNOSIS — Z975 Presence of (intrauterine) contraceptive device: Secondary | ICD-10-CM | POA: Diagnosis not present

## 2020-08-27 DIAGNOSIS — Z6841 Body Mass Index (BMI) 40.0 and over, adult: Secondary | ICD-10-CM

## 2020-08-27 DIAGNOSIS — Z01419 Encounter for gynecological examination (general) (routine) without abnormal findings: Secondary | ICD-10-CM

## 2020-08-27 DIAGNOSIS — N939 Abnormal uterine and vaginal bleeding, unspecified: Secondary | ICD-10-CM

## 2020-08-27 DIAGNOSIS — Z23 Encounter for immunization: Secondary | ICD-10-CM

## 2020-08-27 DIAGNOSIS — Z1211 Encounter for screening for malignant neoplasm of colon: Secondary | ICD-10-CM

## 2020-08-27 NOTE — Patient Instructions (Signed)
Health Maintenance, Female Adopting a healthy lifestyle and getting preventive care are important in promoting health and wellness. Ask your health care provider about:  The right schedule for you to have regular tests and exams.  Things you can do on your own to prevent diseases and keep yourself healthy. What should I know about diet, weight, and exercise? Eat a healthy diet   Eat a diet that includes plenty of vegetables, fruits, low-fat dairy products, and lean protein.  Do not eat a lot of foods that are high in solid fats, added sugars, or sodium. Maintain a healthy weight Body mass index (BMI) is used to identify weight problems. It estimates body fat based on height and weight. Your health care provider can help determine your BMI and help you achieve or maintain a healthy weight. Get regular exercise Get regular exercise. This is one of the most important things you can do for your health. Most adults should:  Exercise for at least 150 minutes each week. The exercise should increase your heart rate and make you sweat (moderate-intensity exercise).  Do strengthening exercises at least twice a week. This is in addition to the moderate-intensity exercise.  Spend less time sitting. Even light physical activity can be beneficial. Watch cholesterol and blood lipids Have your blood tested for lipids and cholesterol at 48 years of age, then have this test every 5 years. Have your cholesterol levels checked more often if:  Your lipid or cholesterol levels are high.  You are older than 48 years of age.  You are at high risk for heart disease. What should I know about cancer screening? Depending on your health history and family history, you may need to have cancer screening at various ages. This may include screening for:  Breast cancer.  Cervical cancer.  Colorectal cancer.  Skin cancer.  Lung cancer. What should I know about heart disease, diabetes, and high blood  pressure? Blood pressure and heart disease  High blood pressure causes heart disease and increases the risk of stroke. This is more likely to develop in people who have high blood pressure readings, are of African descent, or are overweight.  Have your blood pressure checked: ? Every 3-5 years if you are 18-39 years of age. ? Every year if you are 40 years old or older. Diabetes Have regular diabetes screenings. This checks your fasting blood sugar level. Have the screening done:  Once every three years after age 40 if you are at a normal weight and have a low risk for diabetes.  More often and at a younger age if you are overweight or have a high risk for diabetes. What should I know about preventing infection? Hepatitis B If you have a higher risk for hepatitis B, you should be screened for this virus. Talk with your health care provider to find out if you are at risk for hepatitis B infection. Hepatitis C Testing is recommended for:  Everyone born from 1945 through 1965.  Anyone with known risk factors for hepatitis C. Sexually transmitted infections (STIs)  Get screened for STIs, including gonorrhea and chlamydia, if: ? You are sexually active and are younger than 48 years of age. ? You are older than 48 years of age and your health care provider tells you that you are at risk for this type of infection. ? Your sexual activity has changed since you were last screened, and you are at increased risk for chlamydia or gonorrhea. Ask your health care provider if   you are at risk.  Ask your health care provider about whether you are at high risk for HIV. Your health care provider may recommend a prescription medicine to help prevent HIV infection. If you choose to take medicine to prevent HIV, you should first get tested for HIV. You should then be tested every 3 months for as long as you are taking the medicine. Pregnancy  If you are about to stop having your period (premenopausal) and  you may become pregnant, seek counseling before you get pregnant.  Take 400 to 800 micrograms (mcg) of folic acid every day if you become pregnant.  Ask for birth control (contraception) if you want to prevent pregnancy. Osteoporosis and menopause Osteoporosis is a disease in which the bones lose minerals and strength with aging. This can result in bone fractures. If you are 65 years old or older, or if you are at risk for osteoporosis and fractures, ask your health care provider if you should:  Be screened for bone loss.  Take a calcium or vitamin D supplement to lower your risk of fractures.  Be given hormone replacement therapy (HRT) to treat symptoms of menopause. Follow these instructions at home: Lifestyle  Do not use any products that contain nicotine or tobacco, such as cigarettes, e-cigarettes, and chewing tobacco. If you need help quitting, ask your health care provider.  Do not use street drugs.  Do not share needles.  Ask your health care provider for help if you need support or information about quitting drugs. Alcohol use  Do not drink alcohol if: ? Your health care provider tells you not to drink. ? You are pregnant, may be pregnant, or are planning to become pregnant.  If you drink alcohol: ? Limit how much you use to 0-1 drink a day. ? Limit intake if you are breastfeeding.  Be aware of how much alcohol is in your drink. In the [place] one drink equals one 12 oz bottle of beer (355 mL), one 5 oz glass of wine (148 mL), or one 1 oz glass of hard liquor (44 mL). General instructions  Schedule regular health, dental, and eye exams.  Stay current with your vaccines.  Tell your health care provider if: ? You often feel depressed. ? You have ever been abused or do not feel safe at home. Summary  Adopting a healthy lifestyle and getting preventive care are important in promoting health and wellness.  Follow your health care provider's instructions about healthy  diet, exercising, and getting tested or screened for diseases.  Follow your health care provider's instructions on monitoring your cholesterol and blood pressure. This information is not intended to replace advice given to you by your health care provider. Make sure you discuss any questions you have with your health care provider. Document Revised: 10/31/2018 Document Reviewed: 10/31/2018 Elsevier Patient Education  2020 Elsevier Inc.  

## 2020-08-27 NOTE — Progress Notes (Signed)
Pt present for annual exam. Pt stated that she was doing well. Flu vaccine completed today.

## 2020-08-27 NOTE — Progress Notes (Addendum)
GYNECOLOGY ANNUAL PHYSICAL EXAM PROGRESS NOTE  Subjective:    Kara Richmond is a 49 y.o. G39P1001 female who presents for an annual exam.. The patient is sexually active.  The patient wears seatbelts: yes. The patient participates in regular exercise: no. Has the patient ever been transfused or tattooed?: no. The patient reports that there is not domestic violence in her life.     The patient has the following complaints today:  1. She reports that she is still having irregular spotting and bleeding.  She has thought about her options more, and has decided that she would like surgical intervention at this time with endometrial ablation.    Gynecologic History  Contraception: Pargard IUD History of STI's: Denies Last Pap: 04/17/2019. Results were: normal.  Denies h/o abnormal pap smears. Last mammogram: 06/02/2020. Results were: normal   Menstrual History: Menarche age: 88 Patient's last menstrual period was 08/03/2020. Period Duration (Days): 5 Period Pattern: Regular Menstrual Flow: Moderate, Heavy Menstrual Control: Maxi pad Menstrual Control Change Freq (Hours): 3-4 Dysmenorrhea: None   OB History  Gravida Para Term Preterm AB Living  1 1 1  0 0 1  SAB TAB Ectopic Multiple Live Births  0 0 0 0 1    # Outcome Date GA Lbr Len/2nd Weight Sex Delivery Anes PTL Lv  1 Term 2008    F Vag-Spont   LIV    Past Medical History:  Diagnosis Date  . Anxiety and depression   . AR (allergic rhinitis)   . Chronic osteoarthritis   . Clotting disorder ([place])    x2 lung; x1 left leg  . Controlled insomnia   . Diabetes mellitus without complication ([place])   . Dyslipidemia   . Heart murmur   . Hx of deep vein thrombophlebitis of lower extremity   . Hyperlipidemia   . Hypertension   . Lymphedema of leg   . Mild aortic regurgitation   . Morbid obesity ([place])   . Muscle spasm   . PMT (premenstrual tension)   . Vitamin D deficiency     Past Surgical History:  Procedure  Laterality Date  . CHOLECYSTECTOMY    . GASTRIC BYPASS      Family History  Problem Relation Age of Onset  . Hypertension Mother   . Diabetes Mother   . CVA Mother   . Hypertension Father   . Hypertension Sister   . Diabetes Sister   . Breast cancer Neg Hx     Social History   Socioeconomic History  . Marital status: Single    Spouse name: Not on file  . Number of children: Not on file  . Years of education: Not on file  . Highest education level: Not on file  Occupational History  . Not on file  Tobacco Use  . Smoking status: Never Smoker  . Smokeless tobacco: Never Used  Vaping Use  . Vaping Use: Never used  Substance and Sexual Activity  . Alcohol use: No    Alcohol/week: 0.0 standard drinks  . Drug use: No  . Sexual activity: Yes    Birth control/protection: I.U.D.  Other Topics Concern  . Not on file  Social History Narrative  . Not on file   Social Determinants of Health   Financial Resource Strain:   . Difficulty of Paying Living Expenses: Not on file  Food Insecurity:   . Worried About Charity fundraiser in the Last Year: Not on file  . Ran Out of Food  in the Last Year: Not on file  Transportation Needs:   . Lack of Transportation (Medical): Not on file  . Lack of Transportation (Non-Medical): Not on file  Physical Activity:   . Days of Exercise per Week: Not on file  . Minutes of Exercise per Session: Not on file  Stress:   . Feeling of Stress : Not on file  Social Connections:   . Frequency of Communication with Friends and Family: Not on file  . Frequency of Social Gatherings with Friends and Family: Not on file  . Attends Religious Services: Not on file  . Active Member of Clubs or Organizations: Not on file  . Attends Archivist Meetings: Not on file  . Marital Status: Not on file  Intimate Partner Violence:   . Fear of Current or Ex-Partner: Not on file  . Emotionally Abused: Not on file  . Physically Abused: Not on file  .  Sexually Abused: Not on file    Current Outpatient Medications on File Prior to Visit  Medication Sig Dispense Refill  . aspirin 81 MG tablet Take 81 mg by mouth daily.    . Bismuth Subsalicylate (PEPTO-BISMOL PO) Take by mouth as needed. Reported on 02/11/2016    . calcium carbonate (TUMS - DOSED IN MG ELEMENTAL CALCIUM) 500 MG chewable tablet Chew 1 tablet by mouth daily.    . Cholecalciferol (VITAMIN D) 2000 UNITS tablet Take 2,000 Units by mouth daily.    . citalopram (CELEXA) 40 MG tablet TAKE 1 TABLET BY MOUTH EVERY DAY 30 tablet 0  . lisinopril-hydrochlorothiazide (PRINZIDE,ZESTORETIC) 10-12.5 MG tablet TAKE 1 TABLET BY MOUTH DAILY 30 tablet 0  . paragard intrauterine copper IUD IUD 1 each by Intrauterine route once.     No current facility-administered medications on file prior to visit.    No Known Allergies    Review of Systems Constitutional: negative for chills, fatigue, fevers and sweats Eyes: negative for irritation, redness and visual disturbance Ears, nose, mouth, throat, and face: negative for hearing loss, nasal congestion, snoring and tinnitus Respiratory: negative for asthma, cough, sputum Cardiovascular: negative for chest pain, dyspnea, exertional chest pressure/discomfort, irregular heart beat, palpitations and syncope Gastrointestinal: negative for abdominal pain, change in bowel habits, nausea and vomiting Genitourinary: negative for abnormal menstrual periods, genital lesions, sexual problems and vaginal discharge, dysuria and urinary incontinence Integument/breast: negative for breast lump, breast tenderness and nipple discharge Hematologic/lymphatic: negative for bleeding and easy bruising Musculoskeletal:negative for back pain and muscle weakness Neurological: negative for dizziness, headaches, vertigo and weakness Endocrine: negative for diabetic symptoms including polydipsia, polyuria and skin dryness Allergic/Immunologic: negative for hay fever and  urticaria        Objective:  Blood pressure 110/74, pulse 80, height 5' 4"  (1.626 m), weight (!) 361 lb 8 oz (164 kg), last menstrual period 08/03/2020. Body mass index is 62.05 kg/m.   General Appearance:    Alert, cooperative, no distress, appears stated age, morbidly obese  Head:    Normocephalic, without obvious abnormality, atraumatic  Eyes:    PERRL, conjunctiva/corneas clear, EOM's intact, both eyes  Ears:    Normal external ear canals, both ears  Nose:   Nares normal, septum midline, mucosa normal, no drainage or sinus tenderness  Throat:   Lips, mucosa, and tongue normal; teeth and gums normal  Neck:   Supple, symmetrical, trachea midline, no adenopathy; thyroid: no enlargement/tenderness/nodules; no carotid bruit or JVD  Back:     Symmetric, no curvature, ROM normal, no CVA  tenderness  Lungs:     Clear to auscultation bilaterally, respirations unlabored  Chest Wall:    No tenderness or deformity   Heart:    Regular rate and rhythm, S1 and S2 normal, no murmur, rub or gallop  Breast Exam:    No tenderness, masses, or nipple abnormality  Abdomen:     Soft, non-tender, bowel sounds active all four quadrants, no masses, no organomegaly.    Genitalia:    Pelvic exam deferred today per patient requests as she is currently bleeding.   Rectal:    Normal external sphincter.  No hemorrhoids appreciated. Internal exam not done.   Extremities:   Extremities normal, atraumatic, no cyanosis or edema  Pulses:   2+ and symmetric all extremities  Skin:   Skin color, texture, turgor normal, no rashes or lesions  Lymph nodes:   Cervical, supraclavicular, and axillary nodes normal  Neurologic:   CNII-XII intact, normal strength, sensation and reflexes throughout   .  Labs:  Reviewed in Care Everywhere    Assessment:   1. Encounter for well woman exam with routine gynecological exam   2. IUD (intrauterine device) in place   3. Flu vaccine need   4. Morbid obesity with BMI of 50.0-59.9,  adult ([place])   5. Abnormal uterine bleeding (AUB)   6. Colon cancer screening      Plan:    Blood tests: Labs reviewed in Care Everywhere, performed 03/12/2020. . Breast self exam technique reviewed and patient encouraged to perform self-exam monthly. Contraception: Paragard IUD. Discussed healthy lifestyle modifications. Mammogram up to date. Continue yearly screening. Pap smear up to date.  COVID vaccination status: Has completed COVID vaccination series. Can receive booster shot when eligible.  Flu vaccine: administered today.  Further discussion had regarding abnormal bleeding. Patient desires endometrial ablation procedure.  Surgical risks, benefits discussed.  Discussed post-operative expectations.  Patient considered permanent sterilization with BTL however is concerned about more invasive procedures. Also due to BMI, may be difficult to perform laparoscopy. Discussed option of removal of current IUD, performing the ablation, then inserting a new IUD. Patient ok with plan.  Desires to have surgery at the end of the month. Will schedule for 09/18/2020 for Hysteroscopy D&C, [place] endometrial ablation, and IUD removal and reinsertion. Discussed need for COVID screening prior to surgery.  Follow up in 1 year for annual exam.   Rubie Maid, MD [facility]

## 2020-08-30 ENCOUNTER — Encounter: Payer: Self-pay | Admitting: Obstetrics and Gynecology

## 2020-09-15 ENCOUNTER — Other Ambulatory Visit: Payer: Self-pay

## 2020-09-15 ENCOUNTER — Encounter
Admission: RE | Admit: 2020-09-15 | Discharge: 2020-09-15 | Disposition: A | Discharge status: Home / Self Care | Payer: No Typology Code available for payment source | Source: Ambulatory Visit | Attending: Obstetrics and Gynecology | Admitting: Obstetrics and Gynecology

## 2020-09-15 NOTE — Patient Instructions (Signed)
Your procedure is scheduled on: September 18, 2020 FRIDAY Report to Day Surgery on the 2nd floor of the [facility]. To find out your arrival time, please call [phone] between 1PM - 3PM on: September 17, 2020 THURSDAY  REMEMBER: Instructions that are not followed completely may result in serious medical risk, up to and including death; or upon the discretion of your surgeon and anesthesiologist your surgery may need to be rescheduled.  Do not eat food after midnight the night before surgery.  No gum chewing, lozengers or hard candies.  You may however, drink CLEAR liquids up to 2 hours before you are scheduled to arrive for your surgery. Do not drink anything within 2 hours of your scheduled arrival time.  Clear liquids include: - water  - black coffee or tea (Do NOT add milk or creamers to the coffee or tea) Do NOT drink anything that is not on this list.  Type 1 and Type 2 diabetics should only drink water.   TAKE THESE MEDICATIONS THE MORNING OF SURGERY WITH A SIP OF WATER: Buspirone citalopram  Follow recommendations from Cardiologist, Pulmonologist or PCP regarding stopping Aspirin, Coumadin, Plavix, Eliquis, Pradaxa, or Pletal.  One week prior to surgery: Stop Anti-inflammatories (NSAIDS) such as Advil, Aleve, Ibuprofen, Motrin, Naproxen, Naprosyn and Aspirin based products such as Excedrin, Goodys Powder, BC Powder. Stop ANY OVER THE COUNTER supplements until after surgery. (You may continue taking Tylenol, Vitamin D, Vitamin B, and multivitamin.)  No Alcohol for 24 hours before or after surgery.  No Smoking including e-cigarettes for 24 hours prior to surgery.  No chewable tobacco products for at least 6 hours prior to surgery.  No nicotine patches on the day of surgery.  Do not use any "recreational" drugs for at least a week prior to your surgery.  Please be advised that the combination of cocaine and anesthesia may have negative outcomes, up to and including  death. If you test positive for cocaine, your surgery will be cancelled.  On the morning of surgery brush your teeth with toothpaste and water, you may rinse your mouth with mouthwash if you wish. Do not swallow any toothpaste or mouthwash.  Do not wear jewelry, make-up, hairpins, clips or nail polish.  Do not wear lotions, powders, or perfumes.   Do not shave 48 hours prior to surgery.   Contact lenses, hearing aids and dentures may not be worn into surgery.  Do not bring valuables to the hospital. [facility] is not responsible for any missing/lost belongings or valuables.   SHOWER DAY OF DAY  Notify your doctor if there is any change in your medical condition (cold, fever, infection).  Wear comfortable clothing (specific to your surgery type) to the hospital.  Plan for stool softeners for home use; pain medications have a tendency to cause constipation. You can also help prevent constipation by eating foods high in fiber such as fruits and vegetables and drinking plenty of fluids as your diet allows.  After surgery, you can help prevent lung complications by doing breathing exercises.  Take deep breaths and cough every 1-2 hours. Your doctor may order a device called an Incentive Spirometer to help you take deep breaths. When coughing or sneezing, hold a pillow firmly against your incision with both hands. This is called "splinting." Doing this helps protect your incision. It also decreases belly discomfort.  If you are being discharged the day of surgery, you will not be allowed to drive home. You will need a responsible  adult (18 years or older) to drive you home and stay with you that night.   Please call the [facility] at [phone] if you have any questions about these instructions.  Visitation Policy:  Patients undergoing a surgery or procedure may have one family member or support person with them as long as that person is not COVID-19 positive or  experiencing its symptoms.  That person may remain in the waiting area during the procedure.  Inpatient Visitation Update:   In an effort to ensure the safety of our team members and our patients, we are implementing a change to our visitation policy:  Effective Monday, Aug. 9, at 7 a.m., inpatients will be allowed one support person.  o The support person may change daily.  o The support person must pass our screening, gel in and out, and wear a mask at all times, including in the patient's room.  o Patients must also wear a mask when staff or their support person are in the room.  o Masking is required regardless of vaccination status.  Systemwide, no visitors 17 or younger.

## 2020-09-16 ENCOUNTER — Encounter
Admission: RE | Admit: 2020-09-16 | Discharge: 2020-09-16 | Disposition: A | Discharge status: Home / Self Care | Payer: 59 | Source: Ambulatory Visit | Attending: Obstetrics and Gynecology | Admitting: Obstetrics and Gynecology

## 2020-09-16 DIAGNOSIS — Z20822 Contact with and (suspected) exposure to covid-19: Secondary | ICD-10-CM | POA: Insufficient documentation

## 2020-09-16 DIAGNOSIS — Z01818 Encounter for other preprocedural examination: Secondary | ICD-10-CM | POA: Insufficient documentation

## 2020-09-16 DIAGNOSIS — I10 Essential (primary) hypertension: Secondary | ICD-10-CM | POA: Insufficient documentation

## 2020-09-16 LAB — CBC WITH DIFFERENTIAL/PLATELET
Abs Immature Granulocytes: 0.01 10*3/uL (ref 0.00–0.07)
Basophils Absolute: 0 10*3/uL (ref 0.0–0.1)
Basophils Relative: 1 %
Eosinophils Absolute: 0 10*3/uL (ref 0.0–0.5)
Eosinophils Relative: 0 %
HCT: 39.6 % (ref 36.0–46.0)
Hemoglobin: 12.8 g/dL (ref 12.0–15.0)
Immature Granulocytes: 0 %
Lymphocytes Relative: 36 %
Lymphs Abs: 2 10*3/uL (ref 0.7–4.0)
MCH: 26.7 pg (ref 26.0–34.0)
MCHC: 32.3 g/dL (ref 30.0–36.0)
MCV: 82.7 fL (ref 80.0–100.0)
Monocytes Absolute: 0.5 10*3/uL (ref 0.1–1.0)
Monocytes Relative: 9 %
Neutro Abs: 3.1 10*3/uL (ref 1.7–7.7)
Neutrophils Relative %: 54 %
Platelets: 321 10*3/uL (ref 150–400)
RBC: 4.79 MIL/uL (ref 3.87–5.11)
RDW: 13.8 % (ref 11.5–15.5)
WBC: 5.7 10*3/uL (ref 4.0–10.5)
nRBC: 0 % (ref 0.0–0.2)

## 2020-09-16 LAB — BASIC METABOLIC PANEL
Anion gap: 9 (ref 5–15)
BUN: 15 mg/dL (ref 6–20)
CO2: 27 mmol/L (ref 22–32)
Calcium: 8.6 mg/dL — ABNORMAL LOW (ref 8.9–10.3)
Chloride: 98 mmol/L (ref 98–111)
Creatinine, Ser: 0.94 mg/dL (ref 0.44–1.00)
GFR, Estimated: >60 mL/min (ref >60)
Glucose, Bld: 97 mg/dL (ref 70–99)
Potassium: 3.4 mmol/L — ABNORMAL LOW (ref 3.5–5.1)
Sodium: 134 mmol/L — ABNORMAL LOW (ref 135–145)

## 2020-09-16 LAB — SARS CORONAVIRUS 2 (TAT 6-24 HRS): SARS Coronavirus 2: NEGATIVE

## 2020-09-16 LAB — EXTERNAL GENERIC LAB PROCEDURE: COLOGUARD: NEGATIVE

## 2020-09-16 LAB — COLOGUARD
COLOGUARD: NEGATIVE
Cologuard: NEGATIVE

## 2020-09-18 ENCOUNTER — Ambulatory Visit
Admission: RE | Admit: 2020-09-18 | Discharge: 2020-09-18 | Disposition: A | Discharge status: Home / Self Care | Payer: 59 | Attending: Obstetrics and Gynecology | Admitting: Obstetrics and Gynecology

## 2020-09-18 ENCOUNTER — Ambulatory Visit: Payer: 59 | Admitting: Anesthesiology

## 2020-09-18 ENCOUNTER — Encounter: Payer: Self-pay | Admitting: Obstetrics and Gynecology

## 2020-09-18 ENCOUNTER — Encounter
Admission: RE | Disposition: A | Discharge status: Home / Self Care | Payer: Self-pay | Source: Home / Self Care | Attending: Obstetrics and Gynecology

## 2020-09-18 DIAGNOSIS — N939 Abnormal uterine and vaginal bleeding, unspecified: Secondary | ICD-10-CM

## 2020-09-18 DIAGNOSIS — N711 Chronic inflammatory disease of uterus: Secondary | ICD-10-CM | POA: Insufficient documentation

## 2020-09-18 DIAGNOSIS — Z30433 Encounter for removal and reinsertion of intrauterine contraceptive device: Secondary | ICD-10-CM | POA: Diagnosis not present

## 2020-09-18 DIAGNOSIS — Z6841 Body Mass Index (BMI) 40.0 and over, adult: Secondary | ICD-10-CM | POA: Insufficient documentation

## 2020-09-18 DIAGNOSIS — N938 Other specified abnormal uterine and vaginal bleeding: Secondary | ICD-10-CM

## 2020-09-18 DIAGNOSIS — Z975 Presence of (intrauterine) contraceptive device: Secondary | ICD-10-CM | POA: Diagnosis not present

## 2020-09-18 DIAGNOSIS — E669 Obesity, unspecified: Secondary | ICD-10-CM

## 2020-09-18 DIAGNOSIS — N921 Excessive and frequent menstruation with irregular cycle: Secondary | ICD-10-CM

## 2020-09-18 DIAGNOSIS — E668 Other obesity: Secondary | ICD-10-CM

## 2020-09-18 HISTORY — PX: DILATATION AND CURETTAGE/HYSTEROSCOPY WITH MINERVA: SHX6851

## 2020-09-18 HISTORY — PX: IUD REMOVAL: SHX5392

## 2020-09-18 HISTORY — PX: INTRAUTERINE DEVICE (IUD) INSERTION: SHX5877

## 2020-09-18 LAB — POCT PREGNANCY, URINE: Preg Test, Ur: NEGATIVE

## 2020-09-18 LAB — GLUCOSE, CAPILLARY: Glucose-Capillary: 108 mg/dL — ABNORMAL HIGH (ref 70–99)

## 2020-09-18 SURGERY — REMOVAL, INTRAUTERINE DEVICE
Anesthesia: General

## 2020-09-18 MED ORDER — KETOROLAC TROMETHAMINE 30 MG/ML IJ SOLN
INTRAMUSCULAR | Status: DC | PRN
  Administered 2020-09-18: 30 mg via INTRAVENOUS

## 2020-09-18 MED ORDER — PROPOFOL 10 MG/ML IV BOLUS
INTRAVENOUS | Status: DC | PRN
  Administered 2020-09-18: 40 mg via INTRAVENOUS
  Administered 2020-09-18: 200 mg via INTRAVENOUS

## 2020-09-18 MED ORDER — FENTANYL CITRATE (PF) 100 MCG/2ML IJ SOLN: 25.0000 ug | INTRAMUSCULAR | Status: DC | PRN

## 2020-09-18 MED ORDER — FENTANYL CITRATE (PF) 100 MCG/2ML IJ SOLN
INTRAMUSCULAR | Status: AC
  Filled 2020-09-18: qty 2

## 2020-09-18 MED ORDER — OXYCODONE HCL 5 MG PO TABS: 5.0000 mg | ORAL_TABLET | Freq: Once | ORAL | Status: DC | PRN

## 2020-09-18 MED ORDER — FENTANYL CITRATE (PF) 100 MCG/2ML IJ SOLN
INTRAMUSCULAR | Status: DC | PRN
  Administered 2020-09-18 (×2): 50 ug via INTRAVENOUS

## 2020-09-18 MED ORDER — SUCCINYLCHOLINE CHLORIDE 20 MG/ML IJ SOLN
INTRAMUSCULAR | Status: DC | PRN
  Administered 2020-09-18: 180 mg via INTRAVENOUS

## 2020-09-18 MED ORDER — PROPOFOL 10 MG/ML IV BOLUS
INTRAVENOUS | Status: AC
  Filled 2020-09-18: qty 20

## 2020-09-18 MED ORDER — OXYCODONE HCL 5 MG/5ML PO SOLN: 5.0000 mg | Freq: Once | ORAL | Status: DC | PRN

## 2020-09-18 MED ORDER — POVIDONE-IODINE 10 % EX SWAB: 2.0000 "application " | Freq: Once | CUTANEOUS | Status: DC

## 2020-09-18 MED ORDER — MIDAZOLAM HCL 2 MG/2ML IJ SOLN
INTRAMUSCULAR | Status: DC | PRN
  Administered 2020-09-18: 2 mg via INTRAVENOUS

## 2020-09-18 MED ORDER — LACTATED RINGERS IV SOLN: INTRAVENOUS | Status: DC

## 2020-09-18 MED ORDER — CHLORHEXIDINE GLUCONATE 0.12 % MT SOLN: 15.0000 mL | Freq: Once | OROMUCOSAL | Status: AC

## 2020-09-18 MED ORDER — PARAGARD INTRAUTERINE COPPER IU IUD: INTRAUTERINE_SYSTEM | Freq: Once | INTRAUTERINE | Status: AC

## 2020-09-18 MED ORDER — ONDANSETRON HCL 4 MG/2ML IJ SOLN: 4.0000 mg | Freq: Once | INTRAMUSCULAR | Status: DC | PRN

## 2020-09-18 MED ORDER — PARAGARD INTRAUTERINE COPPER IU IUD
INTRAUTERINE_SYSTEM | INTRAUTERINE | Status: AC
  Filled 2020-09-18: qty 1

## 2020-09-18 MED ORDER — ACETAMINOPHEN 500 MG PO TABS
ORAL_TABLET | ORAL | Status: AC
  Administered 2020-09-18: 1000 mg via ORAL
  Filled 2020-09-18: qty 2

## 2020-09-18 MED ORDER — LIDOCAINE HCL (CARDIAC) PF 100 MG/5ML IV SOSY
PREFILLED_SYRINGE | INTRAVENOUS | Status: DC | PRN
  Administered 2020-09-18: 100 mg via INTRAVENOUS

## 2020-09-18 MED ORDER — FAMOTIDINE 20 MG PO TABS: 20.0000 mg | ORAL_TABLET | Freq: Once | ORAL | Status: AC

## 2020-09-18 MED ORDER — FAMOTIDINE 20 MG PO TABS
ORAL_TABLET | ORAL | Status: AC
  Administered 2020-09-18: 20 mg via ORAL
  Filled 2020-09-18: qty 1

## 2020-09-18 MED ORDER — SILVER NITRATE-POT NITRATE 75-25 % EX MISC
CUTANEOUS | Status: AC
  Filled 2020-09-18: qty 10

## 2020-09-18 MED ORDER — CHLORHEXIDINE GLUCONATE 0.12 % MT SOLN
OROMUCOSAL | Status: AC
  Administered 2020-09-18: 15 mL via OROMUCOSAL
  Filled 2020-09-18: qty 15

## 2020-09-18 MED ORDER — DEXAMETHASONE SODIUM PHOSPHATE 10 MG/ML IJ SOLN
INTRAMUSCULAR | Status: DC | PRN
  Administered 2020-09-18: 10 mg via INTRAVENOUS

## 2020-09-18 MED ORDER — IBUPROFEN 600 MG PO TABS: 600.0000 mg | ORAL_TABLET | Freq: Four times a day (QID) | ORAL | 1 refills | Status: AC | PRN

## 2020-09-18 MED ORDER — ORAL CARE MOUTH RINSE: 15.0000 mL | Freq: Once | OROMUCOSAL | Status: AC

## 2020-09-18 MED ORDER — ACETAMINOPHEN 500 MG PO TABS: 1000.0000 mg | ORAL_TABLET | ORAL | Status: AC

## 2020-09-18 MED ORDER — LEVONORGESTREL 20 MCG/24HR IU IUD
INTRAUTERINE_SYSTEM | INTRAUTERINE | Status: AC
  Filled 2020-09-18: qty 1

## 2020-09-18 MED ORDER — ONDANSETRON HCL 4 MG/2ML IJ SOLN
INTRAMUSCULAR | Status: DC | PRN
  Administered 2020-09-18: 4 mg via INTRAVENOUS

## 2020-09-18 MED ORDER — MIDAZOLAM HCL 2 MG/2ML IJ SOLN
INTRAMUSCULAR | Status: AC
  Filled 2020-09-18: qty 2

## 2020-09-18 SURGICAL SUPPLY — 26 items
ABLATOR SURESOUND NOVASURE (ABLATOR) IMPLANT
CANISTER SUCT 1200ML W/VALVE (MISCELLANEOUS) ×2 IMPLANT
CATH ROBINSON RED A/P 16FR (CATHETERS) ×3 IMPLANT
COVER WAND RF STERILE (DRAPES) ×2 IMPLANT
CUP MEDICINE 2OZ PLAST GRAD ST (MISCELLANEOUS) ×2 IMPLANT
DRAPE UNDER BUTTOCK W/FLU (DRAPES) ×2 IMPLANT
DRSG TELFA 3X8 NADH (GAUZE/BANDAGES/DRESSINGS) ×2 IMPLANT
GAUZE 4X4 16PLY RFD (DISPOSABLE) ×2 IMPLANT
GLOVE BIO SURGEON STRL SZ 6.5 (GLOVE) ×2 IMPLANT
GLOVE INDICATOR 7.0 STRL GRN (GLOVE) ×2 IMPLANT
GOWN STRL REUS W/ TWL LRG LVL3 (GOWN DISPOSABLE) ×2 IMPLANT
GOWN STRL REUS W/TWL LRG LVL3 (GOWN DISPOSABLE) ×4
HANDPIECE ABLA MINERVA ENDO (MISCELLANEOUS) ×1 IMPLANT
IV LACTATED RINGERS 1000ML (IV SOLUTION) ×2 IMPLANT
KIT TURNOVER CYSTO (KITS) ×2 IMPLANT
LABEL OR SOLS (LABEL) ×2 IMPLANT
NS IRRIG 500ML POUR BTL (IV SOLUTION) ×2 IMPLANT
PACK DNC HYST (MISCELLANEOUS) ×2 IMPLANT
PAD DRESSING TELFA 3X8 NADH (GAUZE/BANDAGES/DRESSINGS) ×1 IMPLANT
PAD OB MATERNITY 4.3X12.25 (PERSONAL CARE ITEMS) ×2 IMPLANT
PAD PREP 24X41 OB/GYN DISP (PERSONAL CARE ITEMS) ×2 IMPLANT
SEAL ROD LENS SCOPE MYOSURE (ABLATOR) ×2 IMPLANT
SOL PREP PVP 2OZ (MISCELLANEOUS) ×2
SOLUTION PREP PVP 2OZ (MISCELLANEOUS) ×1 IMPLANT
TOWEL OR 17X26 4PK STRL BLUE (TOWEL DISPOSABLE) ×2 IMPLANT
TUBING CONNECTING 10 (TUBING) IMPLANT

## 2020-09-18 NOTE — Anesthesia Procedure Notes (Signed)
Procedure Name: Intubation Date/Time: 09/18/2020 7:45 AM Performed by: Willette Alma, CRNA Pre-anesthesia Checklist: Patient identified, Patient being monitored, Timeout performed, Emergency Drugs available and Suction available Patient Re-evaluated:Patient Re-evaluated prior to induction Oxygen Delivery Method: Circle system utilized Preoxygenation: Pre-oxygenation with 100% oxygen Induction Type: IV induction Ventilation: Mask ventilation without difficulty Laryngoscope Size: McGraph and 4 Grade View: Grade I Tube type: Oral Tube size: 7.0 mm Number of attempts: 1 Airway Equipment and Method: Stylet and Video-laryngoscopy Placement Confirmation: ETT inserted through vocal cords under direct vision,  positive ETCO2 and breath sounds checked- equal and bilateral Secured at: 21 cm Tube secured with: Tape Dental Injury: Teeth and Oropharynx as per pre-operative assessment

## 2020-09-18 NOTE — H&P (Signed)
GYNECOLOGY PREOPERATIVE HISTORY AND PHYSICAL   Subjective:  Kara Richmond is a 49 y.o. G1P1001 here for surgical management of dysfunctional uterine bleeding (perimenopausal).  Currently has a Paragard IUD in place for contraception. Significant preoperative concern for morbid obesity (BMI 62).  .  Proposed surgery: IUD removal, Hysteroscopy D&C with Minerva endometrial ablation, and IUD reinsertion.     Pertinent Gynecological History: Menses: She reports continuous almost daily light to moderate bleeding.  Bleeding: dysfunctional uterine bleeding Contraception: IUD (Paragard) Last mammogram: normal Date: 06/02/2020 Last pap: normal Date: 03/28/2019   Past Medical History:  Diagnosis Date  . Anxiety and depression   . AR (allergic rhinitis)   . Chronic osteoarthritis   . Clotting disorder ([place])    x2 lung; x1 left leg  . Controlled insomnia   . Diabetes mellitus without complication ([place])   . Dyslipidemia   . Heart murmur   . Hx of deep vein thrombophlebitis of lower extremity   . Hyperlipidemia   . Hypertension   . Lymphedema of leg   . Mild aortic regurgitation   . Morbid obesity ([place])   . Muscle spasm   . PMT (premenstrual tension)   . Vitamin D deficiency    Past Surgical History:  Procedure Laterality Date  . CHOLECYSTECTOMY    . GASTRIC BYPASS    . ORIF TIBIA FRACTURE Right 12/2018   OB History  Gravida Para Term Preterm AB Living  1 1 1     1   SAB TAB Ectopic Multiple Live Births          1    # Outcome Date GA Lbr Len/2nd Weight Sex Delivery Anes PTL Lv  1 Term 2008    F Vag-Spont   LIV    Family History  Problem Relation Age of Onset  . Hypertension Mother   . Diabetes Mother   . CVA Mother   . Hypertension Father   . Hypertension Sister   . Diabetes Sister   . Breast cancer Neg Hx     Social History   Socioeconomic History  . Marital status: Single    Spouse name: Not on file  . Number of children: Not on file  . Years of  education: Not on file  . Highest education level: Not on file  Occupational History  . Not on file  Tobacco Use  . Smoking status: Never Smoker  . Smokeless tobacco: Never Used  Vaping Use  . Vaping Use: Never used  Substance and Sexual Activity  . Alcohol use: No    Alcohol/week: 0.0 standard drinks  . Drug use: No  . Sexual activity: Yes    Birth control/protection: I.U.D.  Other Topics Concern  . Not on file  Social History Narrative  . Not on file   Social Determinants of Health   Financial Resource Strain:   . Difficulty of Paying Living Expenses: Not on file  Food Insecurity:   . Worried About Charity fundraiser in the Last Year: Not on file  . Ran Out of Food in the Last Year: Not on file  Transportation Needs:   . Lack of Transportation (Medical): Not on file  . Lack of Transportation (Non-Medical): Not on file  Physical Activity:   . Days of Exercise per Week: Not on file  . Minutes of Exercise per Session: Not on file  Stress:   . Feeling of Stress : Not on file  Social Connections:   . Frequency of  Communication with Friends and Family: Not on file  . Frequency of Social Gatherings with Friends and Family: Not on file  . Attends Religious Services: Not on file  . Active Member of Clubs or Organizations: Not on file  . Attends Archivist Meetings: Not on file  . Marital Status: Not on file  Intimate Partner Violence:   . Fear of Current or Ex-Partner: Not on file  . Emotionally Abused: Not on file  . Physically Abused: Not on file  . Sexually Abused: Not on file    No current facility-administered medications on file prior to encounter.   Current Outpatient Medications on File Prior to Encounter  Medication Sig Dispense Refill  . aspirin EC 81 MG tablet Take 81 mg by mouth daily. Swallow whole.    . bismuth subsalicylate (PEPTO BISMOL) 262 MG/15ML suspension Take 15-30 mLs by mouth every 6 (six) hours as needed for indigestion or diarrhea or  loose stools.    . busPIRone (BUSPAR) 5 MG tablet Take 5 mg by mouth 2 (two) times daily.    . calcium carbonate (TUMS - DOSED IN MG ELEMENTAL CALCIUM) 500 MG chewable tablet Chew 1-2 tablets by mouth 3 (three) times daily as needed for indigestion or heartburn.     . Cholecalciferol (VITAMIN D) 2000 UNITS tablet Take 2,000 Units by mouth daily.    . citalopram (CELEXA) 40 MG tablet TAKE 1 TABLET BY MOUTH EVERY DAY (Patient taking differently: Take 40 mg by mouth daily. ) 30 tablet 0  . lisinopril-hydrochlorothiazide (PRINZIDE,ZESTORETIC) 10-12.5 MG tablet TAKE 1 TABLET BY MOUTH DAILY 30 tablet 0  . paragard intrauterine copper IUD IUD 1 each by Intrauterine route once.    . Potassium 99 MG TABS Take 99 mg by mouth daily.    . pravastatin (PRAVACHOL) 20 MG tablet Take 20 mg by mouth at bedtime.     No Known Allergies    Review of Systems Constitutional: No recent fever/chills/sweats Respiratory: No recent cough/bronchitis Cardiovascular: No chest pain Gastrointestinal: No recent nausea/vomiting/diarrhea Genitourinary: No UTI symptoms Hematologic/lymphatic:No history of coagulopathy or recent blood thinner use    Objective:   Blood pressure 128/81, pulse 78, temperature 98.1 F (36.7 C), temperature source Temporal, resp. rate 20, last menstrual period 09/01/2020, SpO2 100 %. CONSTITUTIONAL: Well-developed, well-nourished female in no acute distress.  HENT:  Normocephalic, atraumatic, External right and left ear normal. Oropharynx is clear and moist EYES: Conjunctivae and EOM are normal. Pupils are equal, round, and reactive to light. No scleral icterus.  NECK: Normal range of motion, supple, no masses SKIN: Skin is warm and dry. No rash noted. Not diaphoretic. No erythema. No pallor. NEUROLOGIC: Alert and oriented to person, place, and time. Normal reflexes, muscle tone coordination. No cranial nerve deficit noted. PSYCHIATRIC: Normal mood and affect. Normal behavior. Normal judgment  and thought content. CARDIOVASCULAR: Normal heart rate noted, regular rhythm RESPIRATORY: Effort and breath sounds normal, no problems with respiration noted ABDOMEN: Soft, nontender, nondistended. PELVIC: Deferred MUSCULOSKELETAL: Normal range of motion. No edema and no tenderness. 2+ distal pulses.    Labs: Results for orders placed or performed during the hospital encounter of 09/18/20 (from the past 336 hour(s))  Pregnancy, urine POC   Collection Time: 09/18/20  6:39 AM  Result Value Ref Range   Preg Test, Ur NEGATIVE NEGATIVE  Results for orders placed or performed during the hospital encounter of 09/16/20 (from the past 336 hour(s))  Basic metabolic panel   Collection Time: 09/16/20  8:32 AM  Result Value Ref Range   Sodium 134 (L) 135 - 145 mmol/L   Potassium 3.4 (L) 3.5 - 5.1 mmol/L   Chloride 98 98 - 111 mmol/L   CO2 27 22 - 32 mmol/L   Glucose, Bld 97 70 - 99 mg/dL   BUN 15 6 - 20 mg/dL   Creatinine, Ser 0.94 0.44 - 1.00 mg/dL   Calcium 8.6 (L) 8.9 - 10.3 mg/dL   GFR, Estimated >60 >60 mL/min   Anion gap 9 5 - 15  CBC WITH DIFFERENTIAL   Collection Time: 09/16/20  8:32 AM  Result Value Ref Range   WBC 5.7 4.0 - 10.5 K/uL   RBC 4.79 3.87 - 5.11 MIL/uL   Hemoglobin 12.8 12.0 - 15.0 g/dL   HCT 39.6 36 - 46 %   MCV 82.7 80.0 - 100.0 fL   MCH 26.7 26.0 - 34.0 pg   MCHC 32.3 30.0 - 36.0 g/dL   RDW 13.8 11.5 - 15.5 %   Platelets 321 150 - 400 K/uL   nRBC 0.0 0.0 - 0.2 %   Neutrophils Relative % 54 %   Neutro Abs 3.1 1.7 - 7.7 K/uL   Lymphocytes Relative 36 %   Lymphs Abs 2.0 0.7 - 4.0 K/uL   Monocytes Relative 9 %   Monocytes Absolute 0.5 0.1 - 1.0 K/uL   Eosinophils Relative 0 %   Eosinophils Absolute 0.0 0.0 - 0.5 K/uL   Basophils Relative 1 %   Basophils Absolute 0.0 0.0 - 0.1 K/uL   Immature Granulocytes 0 %   Abs Immature Granulocytes 0.01 0.00 - 0.07 K/uL  SARS CORONAVIRUS 2 (TAT 6-24 HRS) Nasopharyngeal Nasopharyngeal Swab   Collection Time: 09/16/20  11:54 AM   Specimen: Nasopharyngeal Swab  Result Value Ref Range   SARS Coronavirus 2 NEGATIVE NEGATIVE     Imaging Studies: US PELVIS (TRANSABDOMINAL ONLY) Patient Name: ELLAKATE GONSALVES DOB: 1971-10-16 MRN: [id number] ULTRASOUND REPORT  Location: [facility] Date of Service: 07/21/2020    TECHNIQUE: Both transabdominal and transvaginal ultrasound examinations of the pelvis were performed. Transabdominal technique was performed for global imaging of the pelvis including uterus, ovaries, adnexal regions, and pelvic cul-de-sac. It was necessary to proceed with endovaginal exam following the transabdominal exam to visualize the endometrium and adnexa.  Indications:AUB   Findings:  The uterus is anteverted and measures 8.6 x 3.5 x 5.3 cm. Echo texture is homogenous without evidence of focal masses.  The Endometrium measures 5 mm.IUD seen centrally located within the  endometrium.  Right Ovary measures 3.0 x 1.8 x 3.2 cm. It is normal in appearance. Left Ovary measures 4.1 x 2.3 x 3.1 cm. It is normal in  appearance.Dominate follicle measuring 1.3 x 1.7 x 1.3 cm. Survey of the adnexa demonstrates no adnexal masses. There is no free fluid in the cul de sac.  Impression: 1. IUD is seen in the proper location with-in the endometrium. 2. Lt ovarian dominate follicle as described above. 3. Multiple nabothian cyst were seen within the cervix.  Recommendations: 1.Clinical correlation with the patient's History and Physical Exam.  Jenine M. Albertine Grates    RDMS  I have reviewed this study and agree with documented findings.   Rubie Maid, MD [facility]    Assessment:    Dysfunctional uterine bleeding IUD in place Morbid obesity   Plan:    Counseling: Procedure, risks, reasons, benefits and complications (including injury to bowel, bladder, major blood vessel, ureter, bleeding, possibility of transfusion, infection, or  fistula formation) reviewed in  detail. Likelihood of success in alleviating the patient's condition was discussed. Routine postoperative instructions will be reviewed with the patient and her family in detail after surgery.  The patient concurred with the proposed plan, giving informed written consent for the surgery.   Preop testing ordered.  Patient has been NPO since midnight.    Rubie Maid, MD [facility]

## 2020-09-18 NOTE — Discharge Instructions (Signed)
Endometrial Ablation, Care After This sheet gives you information about how to care for yourself after your procedure. Your health care provider may also give you more specific instructions. If you have problems or questions, contact your health care provider. What can I expect after the procedure? After the procedure, it is common to have:  A need to urinate more frequently than usual for the first 24 hours.  Cramps similar to menstrual cramps. These may last for 1-2 days.  Thin, watery vaginal discharge that is light pink or brown in color. This may last a few weeks. Discharge will be heavy for the first few days after your procedure. You may need to wear a sanitary pad.  Nausea.  Vaginal bleeding for 4-6 weeks after the procedure, as tissue healing occurs. Follow these instructions at home: Activity  Do not drive for 24 hours if you were given amedicine to help you relax (sedative) during your procedure.  Do not have sex or put anything into your vagina until your health care provider approves.  Do not lift anything that is heavier than 10 lb (4.5 kg), or the limit that you are told, until your health care provider says that it is safe.  Return to your normal activities as told by your health care provider. Ask your health care provider what activities are safe for you. General instructions   Take over-the-counter and prescription medicines only as told by your health care provider.  Do not take baths, swim, or use a hot tub until your health care provider approves. You will be able to take showers.  Check your vaginal area every day for signs of infection. Check for: ? Redness, swelling, or pain. ? More discharge or blood, instead of less. ? Bad-smelling discharge.  Keep all follow-up visits as told by your health care provider. This is important.  Drink enough fluid to keep your urine pale yellow. Contact a health care provider if you have:  Vaginal redness, swelling, or  pain.  Vaginal discharge or bleeding that gets worse instead of getting better.  Bad-smelling vaginal discharge.  A fever or chills.  Trouble urinating. Get help right away if you have:  Heavy vaginal bleeding.  Severe cramps. Summary  After endometrial ablation, it is normal to have thin, watery vaginal discharge that is light pink or brown in color. This may last a few weeks and may be heavier right after the procedure.  Vaginal bleeding is also normal after the procedure and should get better with time.  Check your vaginal area every day for signs of infection, such as bad-smelling discharge.  Keep all follow-up visits as told by your health care provider. This is important. This information is not intended to replace advice given to you by your health care provider. Make sure you discuss any questions you have with your health care provider. Document Revised: 02/28/2019 Document Reviewed: 09/19/2017 Elsevier Patient Education  [place].     Intrauterine Device Insertion, Care After  This sheet gives you information about how to care for yourself after your procedure. Your health care provider may also give you more specific instructions. If you have problems or questions, contact your health care provider. What can I expect after the procedure? After the procedure, it is common to have:  Cramps and pain in the abdomen.  Light bleeding (spotting) or heavier bleeding that is like your menstrual period. This may last for up to a few days.  Lower back pain.  Dizziness.  Headaches.  Nausea. Follow these instructions at home:  Before resuming sexual activity, check to make sure that you can feel the IUD string(s). You should be able to feel the end of the string(s) below the opening of your cervix. If your IUD string is in place, you may resume sexual activity. ? If you had a hormonal IUD inserted more than 7 days after your most recent period started, you  will need to use a backup method of birth control for 7 days after IUD insertion. Ask your health care provider whether this applies to you.  Continue to check that the IUD is still in place by feeling for the string(s) after every menstrual period, or once a month.  Take over-the-counter and prescription medicines only as told by your health care provider.  Do not drive or use heavy machinery while taking prescription pain medicine.  Keep all follow-up visits as told by your health care provider. This is important. Contact a health care provider if:  You have bleeding that is heavier or lasts longer than a normal menstrual cycle.  You have a fever.  You have cramps or abdominal pain that get worse or do not get better with medicine.  You develop abdominal pain that is new or is not in the same area of earlier cramping and pain.  You feel lightheaded or weak.  You have abnormal or bad-smelling discharge from your vagina.  You have pain during sexual activity.  You have any of the following problems with your IUD string(s): ? The string bothers or hurts you or your sexual partner. ? You cannot feel the string. ? The string has gotten longer.  You can feel the IUD in your vagina.  You think you may be pregnant, or you miss your menstrual period.  You think you may have an STI (sexually transmitted infection). Get help right away if:  You have flu-like symptoms.  You have a fever and chills.  You can feel that your IUD has slipped out of place. Summary  After the procedure, it is common to have cramps and pain in the abdomen. It is also common to have light bleeding (spotting) or heavier bleeding that is like your menstrual period.  Continue to check that the IUD is still in place by feeling for the string(s) after every menstrual period, or once a month.  Keep all follow-up visits as told by your health care provider. This is important.  Contact your health care  provider if you have problems with your IUD string(s), such as the string getting longer or bothering you or your sexual partner. This information is not intended to replace advice given to you by your health care provider. Make sure you discuss any questions you have with your health care provider. Document Revised: 10/20/2017 Document Reviewed: 09/28/2016 Elsevier Patient Education  2020 [employer].

## 2020-09-18 NOTE — Transfer of Care (Signed)
Immediate Anesthesia Transfer of Care Note  Patient: Kara Richmond  Procedure(s) Performed: INTRAUTERINE DEVICE (IUD) REMOVAL (N/A ) INTRAUTERINE DEVICE (IUD) INSERTION (N/A ) DILATATION AND CURETTAGE/HYSTEROSCOPY WITH MINERVA (N/A )  Patient Location: PACU  Anesthesia Type:General  Level of Consciousness: awake, alert  and oriented  Airway & Oxygen Therapy: Patient Spontanous Breathing and Patient connected to face mask oxygen  Post-op Assessment: Report given to RN and Post -op Vital signs reviewed and stable  Post vital signs: Reviewed and stable  Last Vitals:  Vitals Value Taken Time  BP 115/71 09/18/20 0859  Temp    Pulse 83 09/18/20 0900  Resp 21 09/18/20 0901  SpO2 92 % 09/18/20 0900  Vitals shown include unvalidated device data.  Last Pain:  Vitals:   09/18/20 0633  TempSrc: Temporal  PainSc: 0-No pain         Complications: No complications documented.

## 2020-09-18 NOTE — Anesthesia Postprocedure Evaluation (Signed)
Anesthesia Post Note  Patient: Kara Richmond  Procedure(s) Performed: INTRAUTERINE DEVICE (IUD) REMOVAL (N/A ) INTRAUTERINE DEVICE (IUD) INSERTION (N/A ) DILATATION AND CURETTAGE/HYSTEROSCOPY WITH MINERVA (N/A )  Patient location during evaluation: PACU Anesthesia Type: General Level of consciousness: awake and alert Pain management: pain level controlled Vital Signs Assessment: post-procedure vital signs reviewed and stable Respiratory status: spontaneous breathing, nonlabored ventilation and respiratory function stable Cardiovascular status: blood pressure returned to baseline and stable Postop Assessment: no apparent nausea or vomiting Anesthetic complications: no   No complications documented.   Last Vitals:  Vitals:   09/18/20 0929 09/18/20 0943  BP:  127/73  Pulse:  67  Resp:  18  Temp:  (!) 35.9 C  SpO2: 96% 93%    Last Pain:  Vitals:   09/18/20 0943  TempSrc: Temporal  PainSc: 0-No pain                 Brett Canales Cleland Simkins

## 2020-09-18 NOTE — Anesthesia Preprocedure Evaluation (Addendum)
Anesthesia Evaluation  Patient identified by MRN, date of birth, ID band Patient awake    Reviewed: Allergy & Precautions, H&P , NPO status , Patient's Chart, lab work & pertinent test results  History of Anesthesia Complications Negative for: history of anesthetic complications  Airway Mallampati: III  TM Distance: >3 FB Neck ROM: full    Dental  (+) Chipped   Pulmonary neg pulmonary ROS, neg sleep apnea, neg COPD,    breath sounds clear to auscultation       Cardiovascular hypertension, (-) angina(-) Past MI and (-) Cardiac Stents (-) dysrhythmias  Rhythm:regular Rate:Normal  H/o DVT, PE   Neuro/Psych PSYCHIATRIC DISORDERS Anxiety Depression negative neurological ROS     GI/Hepatic negative GI ROS, Neg liver ROS,   Endo/Other  diabetesMorbid obesity (super morbid obesity, BMI 61)  Renal/GU      Musculoskeletal   Abdominal   Peds  Hematology negative hematology ROS (+)   Anesthesia Other Findings Past Medical History: No date: Anxiety and depression No date: AR (allergic rhinitis) No date: Chronic osteoarthritis No date: Clotting disorder ([facility])     Comment:  x2 lung; x1 left leg No date: Controlled insomnia No date: Diabetes mellitus without complication ([facility]) No date: Dyslipidemia No date: Heart murmur No date: Hx of deep vein thrombophlebitis of lower extremity No date: Hyperlipidemia No date: Hypertension No date: Lymphedema of leg No date: Mild aortic regurgitation No date: Morbid obesity ([place]) No date: Muscle spasm No date: PMT (premenstrual tension) No date: Vitamin D deficiency  Past Surgical History: No date: CHOLECYSTECTOMY No date: GASTRIC BYPASS 12/2018: ORIF TIBIA FRACTURE; Right     Reproductive/Obstetrics negative OB ROS                            Anesthesia Physical Anesthesia Plan  ASA: III  Anesthesia Plan: General ETT   Post-op Pain Management:     Induction:   PONV Risk Score and Plan: Ondansetron, Dexamethasone, Midazolam and Treatment may vary due to age or medical condition  Airway Management Planned:   Additional Equipment:   Intra-op Plan:   Post-operative Plan:   Informed Consent: I have reviewed the patients History and Physical, chart, labs and discussed the procedure including the risks, benefits and alternatives for the proposed anesthesia with the patient or authorized representative who has indicated his/her understanding and acceptance.     Dental Advisory Given  Plan Discussed with: Anesthesiologist, CRNA and Surgeon  Anesthesia Plan Comments:         Anesthesia Quick Evaluation

## 2020-09-18 NOTE — Op Note (Signed)
Procedure(s): INTRAUTERINE DEVICE (IUD) REMOVAL INTRAUTERINE DEVICE (IUD) INSERTION DILATATION AND CURETTAGE/HYSTEROSCOPY WITH MINERVA Procedure Note  [name] female 49 y.o. 09/18/2020  Indications: The patient is a 49 y.o. G12P1001 female with dysfunctional uterine bleeding, Paragard IUD in place, morbid obesity (BMI 62). PMH of HTN, DM.   Pre-operative Diagnosis: dysfunctional uterine bleeding, Paragard IUD in place, morbid obesity (BMI 62)  Post-operative Diagnosis: Same  Surgeon: Rubie Maid, MD  Assistants:  Thornell Mule, [place] PA-S.  Anesthesia: General endotracheal anesthesia  Findings: Uterus sounded to 9 cm.  Cervical length 3 cm. IUD threads visible.  Proliferative endometrium.  Tubal ostia were not visualized bilaterally.  No intrauterine masses.  Adequate charring of endometrial tissue post ablation.  No perforations noted.   Procedure Details: The patient was seen in the Holding Room. The risks, benefits, complications, treatment options, and expected outcomes were discussed with the patient.  The patient concurred with the proposed plan, giving informed consent.  The site of surgery properly noted/marked. The patient was taken to the Operating Room, identified as Kara Richmond and the procedure verified as Procedure(s) (LRB): INTRAUTERINE DEVICE (IUD) REMOVAL (N/A),  DILATATION AND CURETTAGE/HYSTEROSCOPY WITH MINERVA (N/A), INTRAUTERINE DEVICE (IUD) INSERTION (N/A),. A Time Out was held and the above information confirmed.  She was then placed under general anesthesia without difficulty. She was placed in the dorsal lithotomy position, and was prepped and draped in a sterile manner.  A straight catheterization was performed. A sterile speculum was inserted into the vagina and the cervix was grasped at the anterior lip using a single-toothed tenaculum. The IUD threads were visualized, and grasped with a ring forceps. The IUD was removed intact. The cervical  length was measured, and the uterus was sounded to 9 cm.  Cervical dilation was performed. A 5 mm hysteroscope was introduced into the uterus under direct visualization. The cavity was allowed to fill, and then the entire cavity was explored with the findings described above. The hysteroscope was removed, and a sharp curette was then passed into the uterus and endometrial sampling was collected for pathology.   Next the Minerva instrument was primed per instructions. The instrument was then placed into the endometrial canal and activated. The Minerva instrument was then removed from the uterine cavity.  The hysteroscope was then re-introduced for final survey, with adequate charring of the endometrium noted.  The hysteroscope was removed from the patient's uterine cavity. The Paragard IUD was then placed per manufacturer's recommendations.  Strings trimmed to 3 cm. Tenaculum was removed, good hemostasis noted.  Patient tolerated procedure well.   Patient was given post-procedure instructions.   All instrument and sponge counts were correct at the end of the procedure x 2.  The patient tolerated the procedure well.  She was awakened and taken to the PACU in stable condition.    Estimated Blood Loss:  20 ml      Drains: straight catheterization with 20 ml at start of procedure.          Total IV Fluids:  600 ml  Specimens:  Endometrial curettings         Implants: None         Complications:  None; patient tolerated the procedure well.         Disposition: PACU - hemodynamically stable.         Condition: stable   Rubie Maid, MD [facility]

## 2020-09-21 LAB — SURGICAL PATHOLOGY

## 2020-09-30 ENCOUNTER — Other Ambulatory Visit: Payer: Self-pay

## 2020-09-30 ENCOUNTER — Encounter: Payer: Self-pay | Admitting: Obstetrics and Gynecology

## 2020-09-30 ENCOUNTER — Ambulatory Visit (INDEPENDENT_AMBULATORY_CARE_PROVIDER_SITE_OTHER): Payer: 59 | Admitting: Obstetrics and Gynecology

## 2020-09-30 VITALS — BP 114/80 | HR 71 | Ht 64.0 in | Wt 362.6 lb

## 2020-09-30 DIAGNOSIS — Z9889 Other specified postprocedural states: Secondary | ICD-10-CM

## 2020-09-30 DIAGNOSIS — Z8742 Personal history of other diseases of the female genital tract: Secondary | ICD-10-CM

## 2020-09-30 DIAGNOSIS — Z4889 Encounter for other specified surgical aftercare: Secondary | ICD-10-CM

## 2020-09-30 DIAGNOSIS — Z30431 Encounter for routine checking of intrauterine contraceptive device: Secondary | ICD-10-CM

## 2020-09-30 NOTE — Progress Notes (Signed)
    OBSTETRICS/GYNECOLOGY POST-OPERATIVE CLINIC VISIT  Subjective:     Kara Richmond is a 49 y.o. female who presents to the clinic 1 week status post hysteroscopy D&C, endometrial ablation, and Paragard IUD removal and reinsertion for abnormal uterine bleeding.  The patient is not having any pain. Denies vaginal bleeding, and does note a watery brown discharge but is light and intermittent.   The following portions of the patient's history were reviewed and updated as appropriate: allergies, current medications, past family history, past medical history, past social history, past surgical history and problem list.  Review of Systems Pertinent items noted in HPI and remainder of comprehensive ROS otherwise negative.    Objective:    BP 114/80   Pulse 71   Ht 5' 4"  (1.626 m)   Wt (!) 362 lb 9.6 oz (164.5 kg)   LMP 09/01/2020   BMI 62.24 kg/m  General:  alert and no distress  Abdomen: soft, bowel sounds active, non-tender  Pelvis:   external genitalia normal, rectovaginal septum normal.  Vagina with small amount of watery pink-brown discharge.  Cervix normal appearing, no lesions and no motion tenderness.  IUD threads visible, ~ 3 cm in length. Bimanual exam not performed.     Pathology:  Admission on 09/18/2020, Discharged on 09/18/2020  Component Date Value Ref Range Status  . SURGICAL PATHOLOGY 09/18/2020    Final-Edited                   Value:SURGICAL PATHOLOGY CASE: [id number] PATIENT: Kara Richmond Surgical Pathology Report     Specimen Submitted: A. Endometrial currettings  Clinical History: Abnormal uterine bleeding.    DIAGNOSIS: A. ENDOMETRIUM; CURETTAGE: - SUPERFICIAL FRAGMENTS OF BENIGN ENDO- AND ECTOCERVICAL TISSUE WITH MARKED ACUTE AND CHRONIC INFLAMMATION. - NO DEFINITE ENDOMETRIAL GLANDS OR STROMA IDENTIFIED. - NEGATIVE FOR ATYPICAL HYPERPLASIA/EIN, DYSPLASIA, AND MALIGNANCY.  GROSS DESCRIPTION: A. Labeled: Endometrial curettings Received: In  formalin Tissue fragment(s): Multiple Size: 2.7 x 2 x 0.3 cm Description: Aggregate of pink-red soft tissue admixed with blood clot Entirely submitted in 1 cassette.   Final Diagnosis performed by Allena Napoleon, MD.   Electronically signed 09/21/2020 1:26:40PM The electronic signature indicates that the named Attending Pathologist has evaluated the specimen Technical component performed at [facility], [address] [place], [place] [address] [place]                         b: [phone] Dir: Rush Farmer, MD, MMM  Professional component performed at [facility], [facility], [place], [place], [place] [address] Lab: [phone] Dir: Dellia Nims. Reuel Derby, MD      Assessment:   Doing well postoperatively. S/p Hysteroscopy D&C with endometrial ablation and IUD removal/reinsertion H/o abnormal uterine bleeding  Plan:   1. Continue any current medications as needed. 2. Wound care discussed.   Continue pelvic rest for 1 additional week.  3. Operative findings reviewed. Pathology report discussed. 4. Activity restrictions: none 5. Anticipated return to work: now. 6. Follow up: as needed. Return to clinic for any scheduled appointments or for any gynecologic concerns as needed.     Rubie Maid, MD [facility]

## 2020-09-30 NOTE — Progress Notes (Signed)
Pt present for follow up visit after surgeral removal of IUD.  Pt stated that she was doing well no problems.

## 2020-10-29 ENCOUNTER — Encounter: Payer: 59 | Admitting: Obstetrics and Gynecology

## 2020-10-30 ENCOUNTER — Encounter: Payer: 59 | Admitting: Obstetrics and Gynecology

## 2021-07-16 ENCOUNTER — Other Ambulatory Visit: Payer: Self-pay | Admitting: Family Medicine

## 2021-07-16 DIAGNOSIS — Z1231 Encounter for screening mammogram for malignant neoplasm of breast: Secondary | ICD-10-CM

## 2021-09-07 ENCOUNTER — Other Ambulatory Visit: Payer: Self-pay

## 2021-09-07 ENCOUNTER — Ambulatory Visit
Admission: RE | Admit: 2021-09-07 | Discharge: 2021-09-07 | Disposition: A | Discharge status: Home / Self Care | Payer: No Typology Code available for payment source | Source: Ambulatory Visit | Attending: Family Medicine | Admitting: Family Medicine

## 2021-09-07 DIAGNOSIS — Z1231 Encounter for screening mammogram for malignant neoplasm of breast: Secondary | ICD-10-CM | POA: Diagnosis present

## 2021-12-08 ENCOUNTER — Encounter: Payer: Medicaid Other | Admitting: Obstetrics and Gynecology

## 2021-12-22 ENCOUNTER — Other Ambulatory Visit: Payer: Self-pay

## 2021-12-22 ENCOUNTER — Encounter: Payer: Self-pay | Admitting: Obstetrics and Gynecology

## 2021-12-22 ENCOUNTER — Ambulatory Visit (INDEPENDENT_AMBULATORY_CARE_PROVIDER_SITE_OTHER): Payer: Medicaid Other | Admitting: Obstetrics and Gynecology

## 2021-12-22 VITALS — BP 129/88 | HR 64 | Ht 64.0 in | Wt 367.0 lb

## 2021-12-22 DIAGNOSIS — Z975 Presence of (intrauterine) contraceptive device: Secondary | ICD-10-CM | POA: Diagnosis not present

## 2021-12-22 DIAGNOSIS — N951 Menopausal and female climacteric states: Secondary | ICD-10-CM | POA: Diagnosis not present

## 2021-12-22 MED ORDER — NORETHINDRONE ACETATE 5 MG PO TABS: 5.0000 mg | ORAL_TABLET | Freq: Every day | ORAL | 2 refills | Status: DC

## 2021-12-22 NOTE — Progress Notes (Signed)
° ° °  GYNECOLOGY PROGRESS NOTE  Subjective:    Patient ID: Kara Richmond, female    DOB: 01-08-1971, 51 y.o.   MRN: [id number]  HPI  Patient is a 51 y.o. G26P1001 female who presents for IUD check. Notes she has had a cycle each month for the past 3 months, previously was not having any cycles.  Also has history of endometrial ablation.  Notes occasional cramping. Paragard IUD was inserted 09/17/2020.   The following portions of the patient's history were reviewed and updated as appropriate: allergies, current medications, past family history, past medical history, past social history, past surgical history, and problem list.  Review of Systems Pertinent items noted in HPI and remainder of comprehensive ROS otherwise negative.   Objective:   Blood pressure 129/88, pulse 64, height 5' 4"  (1.626 m), weight (!) 367 lb (166.5 kg), SpO2 98 %.  Body mass index is 63 kg/m. General appearance: alert and no distress, morbid obesity Abdomen: soft, non-tender; bowel sounds normal; no masses,  no organomegaly Pelvic: external genitalia normal, rectovaginal septum normal.  Vagina without discharge.  Cervix normal appearing, no lesions and no motion tenderness.  IUD threads visible, 2 cm in length. Uterus and adnexae difficult to palpate due to body habitus.  Extremities: extremities normal, atraumatic, no cyanosis or edema Neurologic: Grossly normal   Assessment:   1. IUD (intrauterine device) in place   2. Perimenopausal      Plan:   - IUD in place offered reassurance.  - Likely perimenopausal at this time. H/o endometrial ablation now with apparent resumption of cycles. Patient does not desire a menstrual cycle. Prescribed 3 months of Aygestin.    Rubie Maid, MD [facility]

## 2022-01-11 ENCOUNTER — Encounter: Payer: Self-pay | Admitting: Obstetrics and Gynecology

## 2022-02-07 MED ORDER — NORETHINDRONE ACETATE 5 MG PO TABS: 15.0000 mg | ORAL_TABLET | Freq: Every day | ORAL | 2 refills | Status: DC

## 2022-04-21 ENCOUNTER — Other Ambulatory Visit: Payer: Self-pay | Admitting: Obstetrics and Gynecology

## 2022-06-16 ENCOUNTER — Ambulatory Visit (INDEPENDENT_AMBULATORY_CARE_PROVIDER_SITE_OTHER): Payer: 59 | Admitting: Obstetrics and Gynecology

## 2022-06-16 ENCOUNTER — Encounter: Payer: Self-pay | Admitting: Obstetrics and Gynecology

## 2022-06-16 ENCOUNTER — Other Ambulatory Visit (HOSPITAL_COMMUNITY)
Admission: RE | Admit: 2022-06-16 | Discharge: 2022-06-16 | Disposition: A | Discharge status: Home / Self Care | Payer: 59 | Source: Ambulatory Visit | Attending: Obstetrics and Gynecology | Admitting: Obstetrics and Gynecology

## 2022-06-16 VITALS — Ht 64.0 in | Wt 347.0 lb

## 2022-06-16 DIAGNOSIS — N760 Acute vaginitis: Secondary | ICD-10-CM

## 2022-06-16 DIAGNOSIS — R634 Abnormal weight loss: Secondary | ICD-10-CM | POA: Diagnosis not present

## 2022-06-16 DIAGNOSIS — B9689 Other specified bacterial agents as the cause of diseases classified elsewhere: Secondary | ICD-10-CM | POA: Diagnosis not present

## 2022-06-16 DIAGNOSIS — N898 Other specified noninflammatory disorders of vagina: Secondary | ICD-10-CM | POA: Insufficient documentation

## 2022-06-16 DIAGNOSIS — A5901 Trichomonal vulvovaginitis: Secondary | ICD-10-CM | POA: Insufficient documentation

## 2022-06-16 MED ORDER — SOLOSEC 2 G PO PACK: 1.0000 | PACK | Freq: Once | ORAL | 0 refills | Status: AC

## 2022-06-16 NOTE — Progress Notes (Signed)
    GYNECOLOGY PROGRESS NOTE  Subjective:    Patient ID: Kara Richmond, female    DOB: 1971/05/14, 51 y.o.   MRN: [id number]    Patient is a 51 y.o. G32P1001 female who presents for vaginal discharge and odor for several weeks. Has a history of BV in the past, feels like it may be this again.    The following portions of the patient's history were reviewed and updated as appropriate: allergies, current medications, past family history, past medical history, past social history, past surgical history, and problem list.  Review of Systems A comprehensive review of systems was negative except for: Constitutional: positive for weight loss (thinks it may be due to the Aygestin as it causes her to sweat more...has lost 20 lbs since initiation of medication is happy with this) .    Objective:   BP not recorded. Height 5\' 4"  (1.626 m), weight (!) 347 lb (157.4 kg). Body mass index is 59.56 kg/m. General appearance: alert, cooperative, and no distress Abdomen: soft, non-tender; bowel sounds normal; no masses,  no organomegaly Pelvic: external genitalia normal, rectovaginal septum normal.  Vagina with moderate thin yellow-gray discharge.  Cervix normal appearing, no lesions and no motion tenderness.  Bimanual exam not performed.  Extremities: extremities normal, atraumatic, no cyanosis or edema Neurologic: Grossly normal   Assessment:   1. Bacterial vaginosis   2. Weight loss      Plan:   - Will treat with Solosec due to recurrent nature. On further assessment, patient notes that she bathes with Spring (~ 3 x daily). Advised to avoid very harsh/fragrant soaps as this can lead to recurrences of her vaginitis.  Advised on Summer's Eve or Vagisil for vaginal area.  - Vaginal culture performed. - Weight loss assumed to be due to recent medication.  Patient happy with side effect.  - RTC as needed.     03-19-1968, MD [facility]

## 2022-06-17 ENCOUNTER — Encounter: Payer: Self-pay | Admitting: Obstetrics and Gynecology

## 2022-06-17 DIAGNOSIS — B9689 Other specified bacterial agents as the cause of diseases classified elsewhere: Secondary | ICD-10-CM

## 2022-06-17 DIAGNOSIS — A599 Trichomoniasis, unspecified: Secondary | ICD-10-CM

## 2022-06-17 LAB — CERVICOVAGINAL ANCILLARY ONLY
Bacterial Vaginitis (gardnerella): POSITIVE — AB
Candida Glabrata: NEGATIVE
Candida Vaginitis: NEGATIVE
Chlamydia: NEGATIVE
Comment: NEGATIVE
Comment: NEGATIVE
Comment: NEGATIVE
Comment: NEGATIVE
Comment: NEGATIVE
Comment: NORMAL
Neisseria Gonorrhea: NEGATIVE
Trichomonas: POSITIVE — AB

## 2022-06-20 MED ORDER — METRONIDAZOLE 500 MG PO TABS
500.0000 mg | ORAL_TABLET | Freq: Two times a day (BID) | ORAL | 1 refills | Status: DC
Start: 2022-06-20 — End: 2022-08-02

## 2022-06-20 NOTE — Telephone Encounter (Signed)
Is it ok to treat her partner with the Flagyl even though you want her to try to Solosec?

## 2022-06-20 NOTE — Telephone Encounter (Signed)
Called patient to inform her that I was waiting on approval of prior authorization. I sent her a savings cared. The $75 is a little costly for her, but she is trying to obtain the money to buy the medication. I told her that I would continue to push the prior authorization and she should her back soon. Prior authorization was initiated on Friday.
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
## 2022-07-14 ENCOUNTER — Other Ambulatory Visit: Payer: Self-pay | Admitting: Obstetrics and Gynecology

## 2022-07-20 ENCOUNTER — Other Ambulatory Visit: Payer: Self-pay

## 2022-07-20 DIAGNOSIS — A599 Trichomoniasis, unspecified: Secondary | ICD-10-CM

## 2022-07-29 ENCOUNTER — Ambulatory Visit (INDEPENDENT_AMBULATORY_CARE_PROVIDER_SITE_OTHER): Payer: 59 | Admitting: Obstetrics and Gynecology

## 2022-07-29 ENCOUNTER — Other Ambulatory Visit (HOSPITAL_COMMUNITY)
Admission: RE | Admit: 2022-07-29 | Discharge: 2022-07-29 | Disposition: A | Discharge status: Home / Self Care | Payer: 59 | Source: Ambulatory Visit | Attending: Obstetrics and Gynecology | Admitting: Obstetrics and Gynecology

## 2022-07-29 VITALS — BP 129/82 | HR 92 | Ht 64.0 in | Wt 347.0 lb

## 2022-07-29 DIAGNOSIS — N760 Acute vaginitis: Secondary | ICD-10-CM | POA: Diagnosis not present

## 2022-07-29 DIAGNOSIS — A599 Trichomoniasis, unspecified: Secondary | ICD-10-CM | POA: Insufficient documentation

## 2022-07-29 NOTE — Progress Notes (Signed)
Patient presents for test of cure for trichomonas infection. She states no symptoms . Self swab culture preformed. All questions asked, patient aware office will be in touch with results.

## 2022-08-02 ENCOUNTER — Encounter: Payer: Self-pay | Admitting: Obstetrics and Gynecology

## 2022-08-02 LAB — CERVICOVAGINAL ANCILLARY ONLY
Bacterial Vaginitis (gardnerella): POSITIVE — AB
Candida Glabrata: NEGATIVE
Candida Vaginitis: NEGATIVE
Chlamydia: NEGATIVE
Comment: NEGATIVE
Comment: NEGATIVE
Comment: NEGATIVE
Comment: NEGATIVE
Comment: NEGATIVE
Comment: NORMAL
Neisseria Gonorrhea: NEGATIVE
Trichomonas: NEGATIVE

## 2022-08-02 MED ORDER — CLINDAMYCIN HCL 300 MG PO CAPS: 300.0000 mg | ORAL_CAPSULE | Freq: Two times a day (BID) | ORAL | 0 refills | Status: DC

## 2022-08-02 NOTE — Addendum Note (Signed)
Addended by: Fabian November on: 08/02/2022 08:45 PM   Modules accepted: Orders

## 2022-08-05 ENCOUNTER — Other Ambulatory Visit: Payer: Self-pay | Admitting: Family Medicine

## 2022-08-05 DIAGNOSIS — Z1231 Encounter for screening mammogram for malignant neoplasm of breast: Secondary | ICD-10-CM

## 2022-12-02 ENCOUNTER — Ambulatory Visit
Admission: RE | Admit: 2022-12-02 | Discharge: 2022-12-02 | Disposition: A | Discharge status: Home / Self Care | Payer: 59 | Source: Ambulatory Visit | Attending: Family Medicine | Admitting: Family Medicine

## 2022-12-02 DIAGNOSIS — Z1231 Encounter for screening mammogram for malignant neoplasm of breast: Secondary | ICD-10-CM | POA: Diagnosis present

## 2022-12-07 ENCOUNTER — Other Ambulatory Visit: Payer: Self-pay | Admitting: Family Medicine

## 2022-12-07 DIAGNOSIS — N6489 Other specified disorders of breast: Secondary | ICD-10-CM

## 2022-12-07 DIAGNOSIS — R928 Other abnormal and inconclusive findings on diagnostic imaging of breast: Secondary | ICD-10-CM

## 2022-12-08 ENCOUNTER — Ambulatory Visit
Admission: RE | Admit: 2022-12-08 | Discharge: 2022-12-08 | Disposition: A | Discharge status: Home / Self Care | Payer: 59 | Source: Ambulatory Visit | Attending: Family Medicine | Admitting: Family Medicine

## 2022-12-08 DIAGNOSIS — N6489 Other specified disorders of breast: Secondary | ICD-10-CM | POA: Diagnosis present

## 2022-12-08 DIAGNOSIS — R928 Other abnormal and inconclusive findings on diagnostic imaging of breast: Secondary | ICD-10-CM

## 2022-12-14 ENCOUNTER — Other Ambulatory Visit: Payer: 59

## 2022-12-24 ENCOUNTER — Other Ambulatory Visit: Payer: Self-pay | Admitting: Obstetrics and Gynecology

## 2023-06-20 ENCOUNTER — Other Ambulatory Visit: Payer: Self-pay | Admitting: Obstetrics and Gynecology

## 2023-07-12 ENCOUNTER — Other Ambulatory Visit: Payer: Self-pay | Admitting: Obstetrics and Gynecology

## 2023-10-22 ENCOUNTER — Other Ambulatory Visit: Payer: Self-pay | Admitting: Obstetrics and Gynecology

## 2023-10-23 ENCOUNTER — Other Ambulatory Visit: Payer: Self-pay

## 2023-10-23 MED ORDER — NORETHINDRONE ACETATE 5 MG PO TABS: 15.0000 mg | ORAL_TABLET | Freq: Every day | ORAL | 0 refills | Status: DC

## 2023-11-19 ENCOUNTER — Other Ambulatory Visit: Payer: Self-pay | Admitting: Obstetrics and Gynecology

## 2023-11-20 ENCOUNTER — Other Ambulatory Visit: Payer: Self-pay | Admitting: Family Medicine

## 2023-11-20 DIAGNOSIS — Z1231 Encounter for screening mammogram for malignant neoplasm of breast: Secondary | ICD-10-CM

## 2023-11-23 ENCOUNTER — Ambulatory Visit: Payer: 59 | Admitting: Obstetrics and Gynecology

## 2023-12-13 ENCOUNTER — Other Ambulatory Visit: Payer: Self-pay | Admitting: Obstetrics and Gynecology
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
## 2023-12-14 ENCOUNTER — Ambulatory Visit: Payer: 59 | Admitting: Obstetrics and Gynecology

## 2023-12-14 ENCOUNTER — Encounter: Payer: Self-pay | Admitting: Obstetrics and Gynecology

## 2023-12-14 ENCOUNTER — Other Ambulatory Visit (HOSPITAL_COMMUNITY)
Admission: RE | Admit: 2023-12-14 | Discharge: 2023-12-14 | Disposition: A | Discharge status: Home / Self Care | Payer: 59 | Source: Ambulatory Visit | Attending: Obstetrics and Gynecology | Admitting: Obstetrics and Gynecology

## 2023-12-14 VITALS — BP 126/75 | HR 82 | Resp 16 | Ht 64.0 in | Wt 329.2 lb

## 2023-12-14 DIAGNOSIS — Z975 Presence of (intrauterine) contraceptive device: Secondary | ICD-10-CM

## 2023-12-14 DIAGNOSIS — E785 Hyperlipidemia, unspecified: Secondary | ICD-10-CM

## 2023-12-14 DIAGNOSIS — I10 Essential (primary) hypertension: Secondary | ICD-10-CM

## 2023-12-14 DIAGNOSIS — Z01419 Encounter for gynecological examination (general) (routine) without abnormal findings: Secondary | ICD-10-CM

## 2023-12-14 DIAGNOSIS — E669 Obesity, unspecified: Secondary | ICD-10-CM

## 2023-12-14 DIAGNOSIS — E559 Vitamin D deficiency, unspecified: Secondary | ICD-10-CM

## 2023-12-14 DIAGNOSIS — Z124 Encounter for screening for malignant neoplasm of cervix: Secondary | ICD-10-CM | POA: Diagnosis present

## 2023-12-14 DIAGNOSIS — N898 Other specified noninflammatory disorders of vagina: Secondary | ICD-10-CM

## 2023-12-14 DIAGNOSIS — Z8742 Personal history of other diseases of the female genital tract: Secondary | ICD-10-CM

## 2023-12-14 DIAGNOSIS — Z1159 Encounter for screening for other viral diseases: Secondary | ICD-10-CM

## 2023-12-14 DIAGNOSIS — Z6841 Body Mass Index (BMI) 40.0 and over, adult: Secondary | ICD-10-CM

## 2023-12-14 DIAGNOSIS — E119 Type 2 diabetes mellitus without complications: Secondary | ICD-10-CM

## 2023-12-14 MED ORDER — NORETHINDRONE ACETATE 5 MG PO TABS: 15.0000 mg | ORAL_TABLET | Freq: Every day | ORAL | 3 refills | Status: DC

## 2023-12-15 LAB — CERVICOVAGINAL ANCILLARY ONLY
Bacterial Vaginitis (gardnerella): NEGATIVE
Candida Glabrata: NEGATIVE
Candida Vaginitis: NEGATIVE
Chlamydia: NEGATIVE
Comment: NEGATIVE
Comment: NEGATIVE
Comment: NEGATIVE
Comment: NEGATIVE
Comment: NEGATIVE
Comment: NORMAL
Neisseria Gonorrhea: NEGATIVE
Trichomonas: NEGATIVE

## 2023-12-16 ENCOUNTER — Encounter: Payer: Self-pay | Admitting: Obstetrics and Gynecology

## 2023-12-19 LAB — CYTOLOGY - PAP
Comment: NEGATIVE
Diagnosis: UNDETERMINED — AB
High risk HPV: NEGATIVE

## 2023-12-22 ENCOUNTER — Encounter: Payer: Self-pay | Admitting: Obstetrics and Gynecology

## 2023-12-22 ENCOUNTER — Other Ambulatory Visit: Payer: Self-pay

## 2023-12-22 DIAGNOSIS — Z1211 Encounter for screening for malignant neoplasm of colon: Secondary | ICD-10-CM

## 2024-01-03 ENCOUNTER — Ambulatory Visit
Admission: RE | Admit: 2024-01-03 | Discharge: 2024-01-03 | Disposition: A | Discharge status: Home / Self Care | Payer: 59 | Source: Ambulatory Visit | Attending: Family Medicine | Admitting: Family Medicine

## 2024-01-03 DIAGNOSIS — Z1231 Encounter for screening mammogram for malignant neoplasm of breast: Secondary | ICD-10-CM | POA: Diagnosis present

## 2024-01-05 ENCOUNTER — Encounter: Payer: Self-pay | Admitting: Obstetrics and Gynecology

## 2024-01-05 LAB — COLOGUARD: COLOGUARD: NEGATIVE

## 2024-09-24 ENCOUNTER — Emergency Department
Admission: EM | Admit: 2024-09-24 | Discharge: 2024-09-24 | Disposition: A | Discharge status: Home / Self Care | Attending: Emergency Medicine | Admitting: Emergency Medicine

## 2024-09-24 ENCOUNTER — Other Ambulatory Visit: Payer: Self-pay

## 2024-09-24 DIAGNOSIS — S39012A Strain of muscle, fascia and tendon of lower back, initial encounter: Secondary | ICD-10-CM | POA: Diagnosis not present

## 2024-09-24 DIAGNOSIS — E119 Type 2 diabetes mellitus without complications: Secondary | ICD-10-CM | POA: Insufficient documentation

## 2024-09-24 DIAGNOSIS — I10 Essential (primary) hypertension: Secondary | ICD-10-CM | POA: Insufficient documentation

## 2024-09-24 DIAGNOSIS — X58XXXA Exposure to other specified factors, initial encounter: Secondary | ICD-10-CM | POA: Insufficient documentation

## 2024-09-24 DIAGNOSIS — S3992XA Unspecified injury of lower back, initial encounter: Secondary | ICD-10-CM | POA: Diagnosis present

## 2024-09-24 MED ORDER — IBUPROFEN 800 MG PO TABS
800.0000 mg | ORAL_TABLET | Freq: Three times a day (TID) | ORAL | 0 refills | Status: AC | PRN
Start: 2024-09-24 — End: ?

## 2024-09-24 MED ORDER — DEXAMETHASONE SOD PHOSPHATE PF 10 MG/ML IJ SOLN
10.0000 mg | Freq: Once | INTRAMUSCULAR | Status: AC
  Administered 2024-09-24: 10 mg via INTRAMUSCULAR

## 2024-09-24 MED ORDER — TIZANIDINE HCL 2 MG PO CAPS: 4.0000 mg | ORAL_CAPSULE | Freq: Three times a day (TID) | ORAL | 0 refills | Status: AC

## 2024-09-24 MED ORDER — KETOROLAC TROMETHAMINE 15 MG/ML IJ SOLN
15.0000 mg | Freq: Once | INTRAMUSCULAR | Status: AC
  Administered 2024-09-24: 15 mg via INTRAMUSCULAR
  Filled 2024-09-24: qty 1

## 2024-09-24 MED ORDER — LIDOCAINE 5 % EX PTCH
1.0000 | MEDICATED_PATCH | CUTANEOUS | Status: DC
  Administered 2024-09-24: 1 via TRANSDERMAL
  Filled 2024-09-24: qty 1

## 2024-09-24 MED ORDER — LIDOCAINE 5 % EX PTCH: 1.0000 | MEDICATED_PATCH | CUTANEOUS | 0 refills | Status: AC

## 2024-09-24 NOTE — ED Notes (Signed)
 See triage note  Presents with lower back pain  States pain is on left side moving into leg   Denies any fall  but states she did a lot of cleaning Ia able to ambulate with slight limp

## 2024-09-24 NOTE — Discharge Instructions (Addendum)
 You were evaluated in the emergency department for your back pain.  Believe your pain is due to a pulled muscle in your back.  This should improve over time.    You can take 650 mg of Tylenol  every 6 hours and 800 mg of ibuprofen  every 8 hours as needed for pain. You can use ice, heat, muscle creams and other topical pain relievers as well.  I have sent a muscle relaxer to your pharmacy. This can be taken every 8 hours as needed for muscle spasms. This medication is sedating, so do not drive for 8 hours after taking it.  You can start by taking 4 mg and can increase to up to 6 mg.  You can schedule a follow-up appointment with the orthopedic provider attached or you can see a previous orthopedic provider.  Please return to the emergency department with any worsening symptoms like loss of bladder or bowel control, development of a fever, loss of sensation in your legs or inability to walk.

## 2024-09-24 NOTE — ED Provider Notes (Signed)
 [facility] Provider Note    Event Date/Time   First MD Initiated Contact with Patient 09/24/24 [id number]     (approximate)   History   Back Pain (Radiating down R leg)   HPI  Kara Richmond is a 53 y.o. female with PMH of HTN, diabetes, obesity presents for evaluation of lower back pain.  Patient states that her symptoms began around 4 days ago.  She took a muscle relaxer and felt better over the weekend but then then some housecleaning and the pain came back last night.  She has occasional radiation down the right leg with some tingling but no numbness or weakness of the leg.  No loss of bladder or bowel control or fevers.  Has been taking Tylenol  without much relief.      Physical Exam   Triage Vital Signs: ED Triage Vitals  Encounter Vitals Group     BP 09/24/24 0720 121/65     Girls Systolic BP Percentile --      Girls Diastolic BP Percentile --      Boys Systolic BP Percentile --      Boys Diastolic BP Percentile --      Pulse Rate 09/24/24 0720 83     Resp 09/24/24 0720 20     Temp 09/24/24 0720 (!) 97.5 F (36.4 C)     Temp Source 09/24/24 0720 Oral     SpO2 09/24/24 0720 100 %     Weight 09/24/24 0718 (!) 321 lb (145.6 kg)     Height 09/24/24 0718 5' 4 (1.626 m)     Head Circumference --      Peak Flow --      Pain Score 09/24/24 0717 8     Pain Loc --      Pain Education --      Exclude from Growth Chart --     Most recent vital signs: Vitals:   09/24/24 0720  BP: 121/65  Pulse: 83  Resp: 20  Temp: (!) 97.5 F (36.4 C)  SpO2: 100%   General: Awake, no distress.  CV:  Good peripheral perfusion.  RRR. Resp:  Normal effort.  CTAB. Abd:  No distention.  Other:  No tenderness to palpation over the spine or paraspinal muscles, negative straight leg raise bilaterally, sensation intact in bilateral lower extremities, posterior tibialis pulses 2+ and regular, strength is equal bilaterally in lower extremities   ED Results /  Procedures / Treatments   Labs (all labs ordered are listed, but only abnormal results are displayed) Labs Reviewed - No data to display   PROCEDURES:  Critical Care performed: No  Procedures   MEDICATIONS ORDERED IN ED: Medications  ketorolac  (TORADOL ) 15 MG/ML injection 15 mg (has no administration in time range)  dexamethasone  (DECADRON ) injection 10 mg (has no administration in time range)  lidocaine  (LIDODERM ) 5 % 1 patch (has no administration in time range)     IMPRESSION / MDM / ASSESSMENT AND PLAN / ED COURSE  I reviewed the triage vital signs and the nursing notes.                             53 year old female presents for evaluation of lower back pain.  Vital signs are stable patient NAD on exam.  Differential diagnosis includes, but is not limited to, lumbar strain, lumbar radiculopathy, spinal stenosis, osteoarthritis, less likely fracture and spinal cord compression.  Patient's presentation is  most consistent with acute, uncomplicated illness.  Physical exam is reassuring and that patient does not have a positive straight leg raise or objective loss of sensation or weakness.  Did consider x-ray but since patient has not had any falls or direct trauma feel that this would be low yield.  Also considered getting an MRI but without objective weakness or loss of sensation did not feel this was needed emergently.  Suspect that patient's pain is due to a muscle strain.  Will plan to treat with Toradol , Decadron  and lidocaine  patch while in the ED.  Plan for outpatient pain management includes ibuprofen , Tylenol , Zanaflex, lidocaine  and ice/heat.  Patient did not need a note for work.  Did discuss return precautions including loss of bladder or bowel function, development of fever or development of leg weakness.  Patient voiced understanding, all questions were answered and she was stable at discharge.      FINAL CLINICAL IMPRESSION(S) / ED DIAGNOSES   Final diagnoses:   Strain of lumbar region, initial encounter     Rx / DC Orders   ED Discharge Orders          Ordered    ibuprofen  (ADVIL ) 800 MG tablet  Every 8 hours PRN        09/24/24 0747    tizanidine (ZANAFLEX) 2 MG capsule  3 times daily        09/24/24 0747    lidocaine  (LIDODERM ) 5 %  Every 24 hours        09/24/24 0747             Note:  This document was prepared using Dragon voice recognition software and may include unintentional dictation errors.   Kara Tinnie LABOR, PA-C 09/24/24 [id number]    Kara Drivers, MD 09/24/24 1436

## 2024-09-24 NOTE — ED Triage Notes (Signed)
 Pt to ED for bilateral lower back pain with sharp shooting pain down R leg since 4 days ago. Pt took muscle relaxer and felt better Sat-Sun and then did some housecleaning and then last night realized her pain had returned. Hx of same 6 years ago.

## 2024-12-02 ENCOUNTER — Telehealth: Payer: Self-pay

## 2024-12-02 DIAGNOSIS — Z8742 Personal history of other diseases of the female genital tract: Secondary | ICD-10-CM

## 2024-12-02 NOTE — Telephone Encounter (Signed)
 SABRA

## 2024-12-02 NOTE — Telephone Encounter (Signed)
 The patient is scheduled for 2/10 at 8:15 am with [facility]. Please advise?

## 2024-12-06 MED ORDER — NORETHINDRONE ACETATE 5 MG PO TABS: 15.0000 mg | ORAL_TABLET | Freq: Every day | ORAL | 0 refills | Status: AC

## 2024-12-06 NOTE — Addendum Note (Signed)
 Addended by: TAFT CAMELIA MATSU on: 12/06/2024 04:22 PM   Modules accepted: Orders

## 2024-12-20 NOTE — Progress Notes (Unsigned)
 "  ANNUAL PREVENTATIVE CARE GYNECOLOGY  ENCOUNTER NOTE  SUBJECTIVE:       Kara Richmond is a 54 y.o. G48P1001 female here for a routine annual gynecologic exam. The patient {is/is not/has never been:13135} sexually active. The patient {is/is not:13135} taking hormone replacement therapy. {post-men bleed:13152::Patient denies post-menopausal vaginal bleeding.} Family history of breast, uterine, ovarian cancer: {yes/no:311178}. The patient wears seatbelts: {yes/no:311178}. The patient participates in regular exercise: {yes/no/not asked:9010}. Has the patient ever been transfused or tattooed?: {yes/no/not asked:9010}. The patient reports that there {is/is not:9024} domestic violence in her life. Has the patient completed the Gardasil vaccine? {yes/no:311178}.  Current complaints: 1.  ***    Gynecologic History No LMP recorded. (Menstrual status: Oral contraceptives). Contraception: IUD Last Pap: 10/13/24. Results were: ASCUS HPV negative History of abnormal pap: *** History of STIs: *** Last Mammogram: 01/08/2024. Results were: normal Last Colonoscopy:  Last Dexa Scan: NA  PHQ-2:     12/14/2023    8:16 AM 02/11/2016    8:26 AM  Depression screen PHQ 2/9  Decreased Interest 0 0  Down, Depressed, Hopeless 1 0  PHQ - 2 Score 1 0    Obstetric History OB History  Gravida Para Term Preterm AB Living  1 1 1   1   SAB IAB Ectopic Multiple Live Births      1    # Outcome Date GA Lbr Len/2nd Weight Sex Type Anes PTL Lv  1 Term 2008    F Vag-Spont   LIV    Past Medical History:  Diagnosis Date   Anxiety and depression    AR (allergic rhinitis)    Chronic osteoarthritis    Clotting disorder    x2 lung; x1 left leg   Controlled insomnia    Diabetes mellitus without complication (HCC)    Dyslipidemia    Encounter for IUD removal    Heart murmur    Hx of deep vein thrombophlebitis of lower extremity    Hyperlipidemia    Hypertension    Lymphedema of leg    Mild aortic  regurgitation    Morbid obesity (HCC)    Muscle spasm    PMT (premenstrual tension)    Vitamin D  deficiency     Family History  Problem Relation Age of Onset   Hypertension Mother    Diabetes Mother    CVA Mother    Hypertension Father    Hypertension Sister    Diabetes Sister    Breast cancer Neg Hx     Past Surgical History:  Procedure Laterality Date   CHOLECYSTECTOMY     DILATATION AND CURETTAGE/HYSTEROSCOPY WITH MINERVA N/A 09/18/2020   Procedure: DILATATION AND CURETTAGE/HYSTEROSCOPY WITH MINERVA;  Surgeon: Connell Davies, MD;  Location: [facility] ORS;  Service: Gynecology;  Laterality: N/A;   GASTRIC BYPASS     INTRAUTERINE DEVICE (IUD) INSERTION N/A 09/18/2020   Procedure: INTRAUTERINE DEVICE (IUD) INSERTION;  Surgeon: Connell Davies, MD;  Location: [facility] ORS;  Service: Gynecology;  Laterality: N/A;  insert paragard    IUD REMOVAL N/A 09/18/2020   Procedure: INTRAUTERINE DEVICE (IUD) REMOVAL;  Surgeon: Connell Davies, MD;  Location: [facility] ORS;  Service: Gynecology;  Laterality: N/A;  Remove paragard    ORIF TIBIA FRACTURE Right 12/2018    Social History   Socioeconomic History   Marital status: Single    Spouse name: Not on file   Number of children: Not on file   Years of education: Not on file   Highest education level: Not on file  Occupational  History   Not on file  Tobacco Use   Smoking status: Never   Smokeless tobacco: Never  Vaping Use   Vaping status: Never Used  Substance and Sexual Activity   Alcohol use: No    Alcohol/week: 0.0 standard drinks of alcohol   Drug use: No   Sexual activity: Not Currently    Birth control/protection: I.U.D.  Other Topics Concern   Not on file  Social History Narrative   Not on file   Social Drivers of Health   Tobacco Use: Low Risk  (11/18/2024)   Received from [facility]   Patient History    Smoking Tobacco Use: Never    Smokeless Tobacco Use: Never    Passive Exposure: Not on file  Financial  Resource Strain: Medium Risk (11/17/2024)   Received from [facility]   Overall Financial Resource Strain (CARDIA)    Difficulty of Paying Living Expenses: Somewhat hard  Food Insecurity: No Food Insecurity (11/17/2024)   Received from [facility]   Epic    Within the past 12 months, you worried that your food would run out before you got the money to buy more.: Never true    Within the past 12 months, the food you bought just didn't last and you didn't have money to get more.: Never true  Transportation Needs: No Transportation Needs (11/17/2024)   Received from [facility] - Transportation    In the past 12 months, has lack of transportation kept you from medical appointments or from getting medications?: No    Lack of Transportation (Non-Medical): No  Physical Activity: Not on file  Stress: Not on file  Social Connections: Not on file  Intimate Partner Violence: Not on file  Depression (PHQ2-9): Low Risk (12/14/2023)   Depression (PHQ2-9)    PHQ-2 Score: 1  Alcohol Screen: Not on file  Housing: Low Risk  (11/17/2024)   Received from [facility]   Epic    In the last 12 months, was there a time when you were not able to pay the mortgage or rent on time?: No    In the past 12 months, how many times have you moved where you were living?: 0    At any time in the past 12 months, were you homeless or living in a shelter (including now)?: No  Utilities: Not At Risk (11/17/2024)   Received from [facility]   Epic    In the past 12 months has the electric, gas, oil, or water company threatened to shut off services in your home?: No  Health Literacy: Not on file    Medications Ordered Prior to Encounter[1]  Allergies[2]   Review of Systems ROS Review of Systems - General ROS: negative for - chills, fatigue, fever, hot flashes, night sweats, weight gain or weight loss Psychological  ROS: negative for - anxiety, decreased libido, depression, mood swings, physical abuse or sexual abuse Ophthalmic ROS: negative for - blurry vision, eye pain or loss of vision ENT ROS: negative for - headaches, hearing change, visual changes or vocal changes Allergy and Immunology ROS: negative for - hives, itchy/watery eyes or seasonal allergies Hematological and Lymphatic ROS: negative for - bleeding problems, bruising, swollen lymph nodes or weight loss Endocrine ROS: negative for - galactorrhea, hair pattern changes, hot flashes, malaise/lethargy, mood swings, palpitations, polydipsia/polyuria, skin changes, temperature intolerance or unexpected weight changes Breast ROS: negative for - new or  changing breast lumps or nipple discharge Respiratory ROS: negative for - cough or shortness of breath Cardiovascular ROS: negative for - chest pain, irregular heartbeat, palpitations or shortness of breath Gastrointestinal ROS: no abdominal pain, change in bowel habits, or black or bloody stools Genito-Urinary ROS: no dysuria, trouble voiding, or hematuria Musculoskeletal ROS: negative for - joint pain or joint stiffness Neurological ROS: negative for - bowel and bladder control changes Dermatological ROS: negative for rash and skin lesion changes   OBJECTIVE:   There were no vitals taken for this visit.  CONSTITUTIONAL: Well-developed, well-nourished female in no acute distress.  PSYCHIATRIC: Normal mood and affect. Normal behavior. Normal judgment and thought content. NEUROLGIC: Alert and oriented to person, place, and time. Normal muscle tone coordination. No cranial nerve deficit noted. HENT:  Normocephalic, atraumatic, External right and left ear normal. Oropharynx is clear and moist EYES: Conjunctivae and EOM are normal. No scleral icterus.  NECK: Normal range of motion, supple, no masses.  Normal thyroid.  SKIN: Skin is warm and dry. No rash noted. Not diaphoretic. No erythema. No  pallor. CARDIOVASCULAR: Normal heart rate noted, regular rhythm, no murmur. RESPIRATORY: Clear to auscultation bilaterally. Effort and breath sounds normal, no problems with respiration noted. BREASTS: Symmetric in size. No masses, skin changes, nipple drainage, or lymphadenopathy. ABDOMEN: Soft, normal bowel sounds, no distention noted.  No tenderness, rebound or guarding.  PELVIC:  Bladder {:311640}  Urethra: {:311719}  Vulva: {:311722}  Vagina: {:311643}  Cervix: {:311644}  Uterus: {:311718}  Adnexa: {:311645}  RV: {Blank multiple:19196::External Exam NormaI,No Rectal Masses,Normal Sphincter tone}  MUSCULOSKELETAL: Normal range of motion. No tenderness.  No cyanosis, clubbing, or edema.  2+ distal pulses. LYMPHATIC: No Axillary, Supraclavicular, or Inguinal Adenopathy.  Labs: Lab Results  Component Value Date   WBC 5.7 09/16/2020   HGB 12.8 09/16/2020   HCT 39.6 09/16/2020   MCV 82.7 09/16/2020   PLT 321 09/16/2020    Lab Results  Component Value Date   CREATININE 0.94 09/16/2020   BUN 15 09/16/2020   NA 134 (L) 09/16/2020   K 3.4 (L) 09/16/2020   CL 98 09/16/2020   CO2 27 09/16/2020    Lab Results  Component Value Date   ALT 10 02/11/2016   AST 16 02/11/2016   ALKPHOS 55 02/11/2016   BILITOT 0.3 02/11/2016    Lab Results  Component Value Date   CHOL 199 02/11/2016   HDL 44 02/11/2016   LDLCALC 131 (H) 02/11/2016   TRIG 119 02/11/2016   CHOLHDL 4.5 (H) 02/11/2016    No results found for: TSH  Lab Results  Component Value Date   HGBA1C 6.5 (H) 02/11/2016     ASSESSMENT:   No diagnosis found.   PLAN:   Kara Richmond is a 54 y.o. G76P1001 female here today for her annual exam, doing well.  Pap: done with cotesting today Mammogram: ordered***due *** Colon: PCP *** ordered colonoscopy***Cologuard -OR- due *** Labs: ***A1C, CMP, HepC, Lipid panel, Vit D, TSH PHQ-2 = ***, discussed coping techniques; RTC if worsens or develops  concern Contraception: *** Healthy lifestyle modifications discussed: multivitamin, diet, exercise, sunscreen, tobacco and alcohol use. Emphasized importance of regular physical activity.  Calcium and Vit D recommendation reviewed.  All questions answered to patient's satisfaction.   Follow up 1 yr for annual, sooner prn.    Estil Mangle, DO [facility]                 [1]  Current Outpatient  Medications on File Prior to Visit  Medication Sig Dispense Refill   aspirin EC 81 MG tablet Take 81 mg by mouth daily. Swallow whole.     bismuth subsalicylate (PEPTO BISMOL) 262 MG/15ML suspension Take 15-30 mLs by mouth every 6 (six) hours as needed for indigestion or diarrhea or loose stools.     busPIRone (BUSPAR) 5 MG tablet Take 5 mg by mouth 2 (two) times daily.     calcium carbonate (TUMS - DOSED IN MG ELEMENTAL CALCIUM) 500 MG chewable tablet Chew 1-2 tablets by mouth 3 (three) times daily as needed for indigestion or heartburn.      Cholecalciferol (VITAMIN D ) 2000 UNITS tablet Take 2,000 Units by mouth daily.     citalopram  (CELEXA ) 40 MG tablet TAKE 1 TABLET BY MOUTH EVERY DAY (Patient taking differently: Take 40 mg by mouth daily.) 30 tablet 0   ibuprofen  (ADVIL ) 600 MG tablet Take 1 tablet (600 mg total) by mouth every 6 (six) hours as needed for mild pain or moderate pain (can alternate with Tylenol ). 30 tablet 1   ibuprofen  (ADVIL ) 800 MG tablet Take 1 tablet (800 mg total) by mouth every 8 (eight) hours as needed. 30 tablet 0   lisinopril -hydrochlorothiazide  (PRINZIDE ,ZESTORETIC ) 10-12.5 MG tablet TAKE 1 TABLET BY MOUTH DAILY 30 tablet 0   norethindrone  (AYGESTIN ) 5 MG tablet Take 3 tablets (15 mg total) by mouth daily. 90 tablet 0   paragard  intrauterine copper  IUD IUD 1 each by Intrauterine route once. Inserted 09/18/2020     Potassium 99 MG TABS Take 99 mg by mouth daily.     pravastatin (PRAVACHOL) 20 MG tablet Take 20 mg by mouth at bedtime.     No  current facility-administered medications on file prior to visit.  [2] No Known Allergies  "

## 2024-12-31 ENCOUNTER — Ambulatory Visit: Admitting: Obstetrics

## 2024-12-31 DIAGNOSIS — Z01419 Encounter for gynecological examination (general) (routine) without abnormal findings: Secondary | ICD-10-CM

## 2024-12-31 DIAGNOSIS — Z1231 Encounter for screening mammogram for malignant neoplasm of breast: Secondary | ICD-10-CM

## 2025-01-30 ENCOUNTER — Ambulatory Visit: Admitting: Licensed Practical Nurse
